# Patient Record
Sex: Male | Born: 1950 | Race: Black or African American | Hispanic: No | Marital: Married | Smoking: Never smoker
Health system: Southern US, Community
[De-identification: ages and names within clinical notes are randomized; demographics above are authoritative.]

## PROBLEM LIST (undated history)

## (undated) DIAGNOSIS — R7303 Prediabetes: Secondary | ICD-10-CM

## (undated) DIAGNOSIS — C801 Malignant (primary) neoplasm, unspecified: Secondary | ICD-10-CM

## (undated) DIAGNOSIS — I77810 Thoracic aortic ectasia: Secondary | ICD-10-CM

## (undated) DIAGNOSIS — M109 Gout, unspecified: Secondary | ICD-10-CM

## (undated) DIAGNOSIS — R0600 Dyspnea, unspecified: Secondary | ICD-10-CM

## (undated) DIAGNOSIS — I10 Essential (primary) hypertension: Secondary | ICD-10-CM

## (undated) DIAGNOSIS — Z86718 Personal history of other venous thrombosis and embolism: Secondary | ICD-10-CM

## (undated) DIAGNOSIS — I499 Cardiac arrhythmia, unspecified: Secondary | ICD-10-CM

## (undated) HISTORY — PX: COLONOSCOPY W/ POLYPECTOMY: SHX1380

---

## 2005-11-24 ENCOUNTER — Emergency Department: Payer: Self-pay | Admitting: Emergency Medicine
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
## 2009-05-02 DIAGNOSIS — I10 Essential (primary) hypertension: Secondary | ICD-10-CM | POA: Insufficient documentation

## 2009-07-14 DIAGNOSIS — E785 Hyperlipidemia, unspecified: Secondary | ICD-10-CM | POA: Insufficient documentation

## 2009-10-22 ENCOUNTER — Ambulatory Visit: Payer: Self-pay | Admitting: Family Medicine

## 2011-03-08 IMAGING — US US EXTREM LOW VENOUS*R*
1 series · 17 of 20 positions shown · non-contrast
Comparison: none

REASON FOR EXAM: call report  [id number]  knee pain hx DVT  eval for DVT and
popliteal
COMMENTS:

[Series 1: us extrem low venous*right* · 17 of 20 slices shown]
[im 1/20]
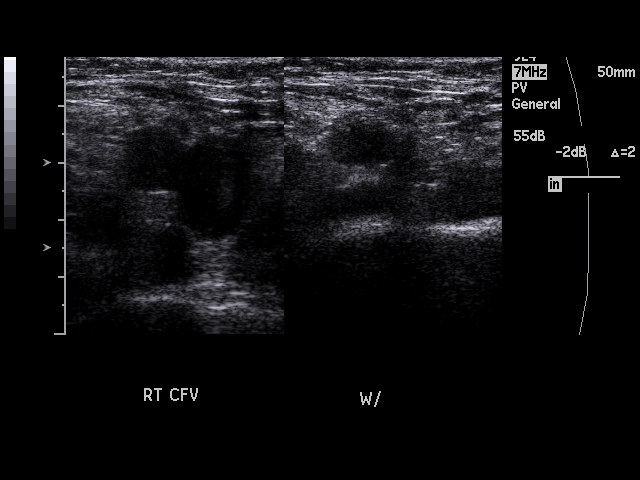
[im 2/20]
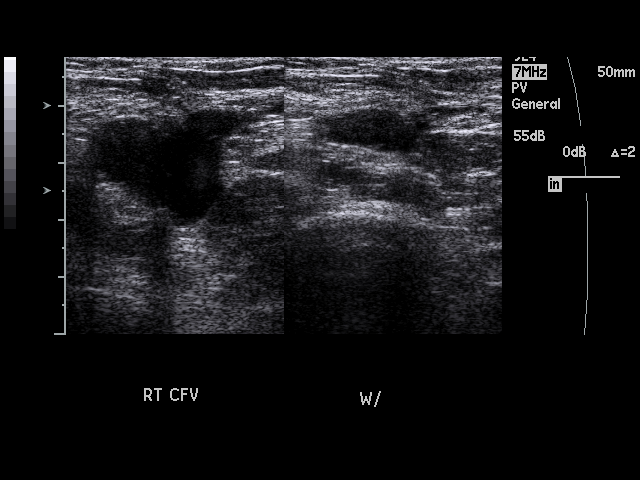
[im 3/20]
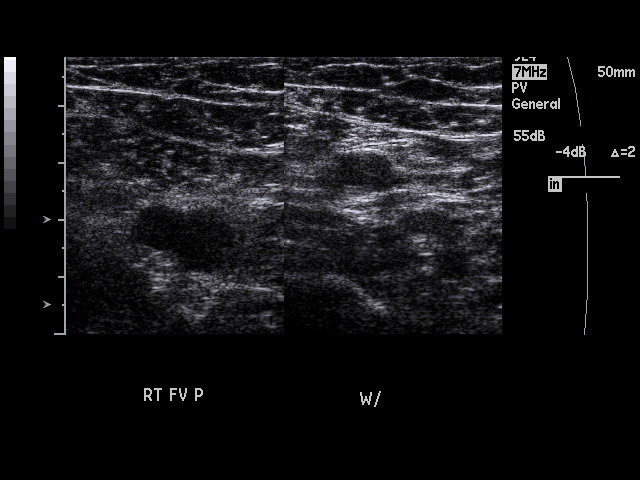
[im 5/20]
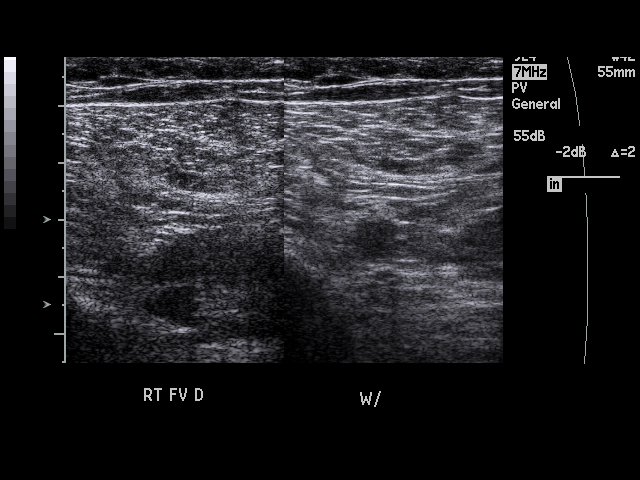
[im 6/20]
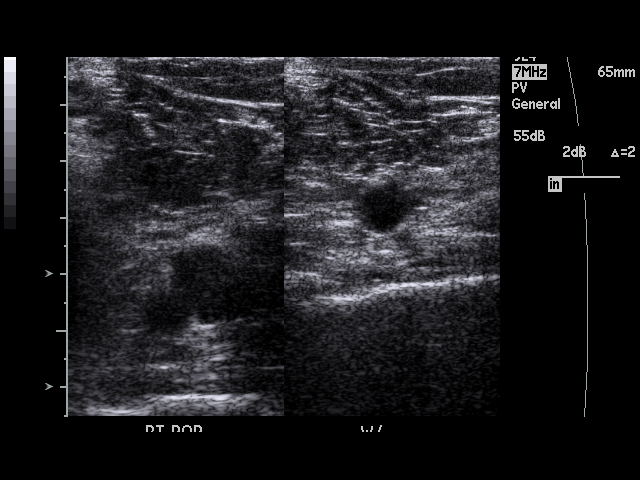
[im 7/20]
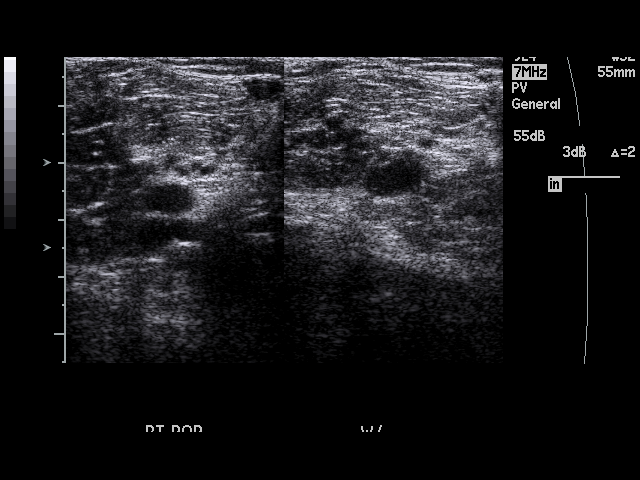
[im 8/20]
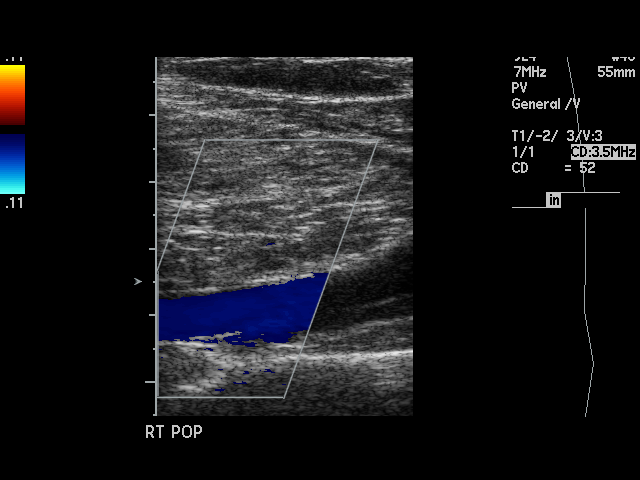
[im 9/20]
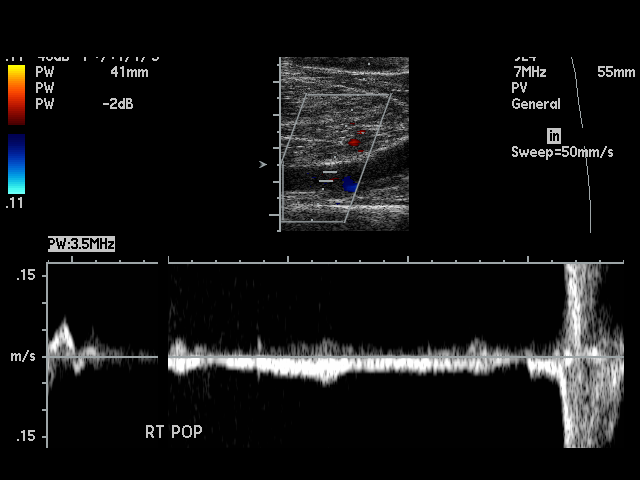
[im 11/20]
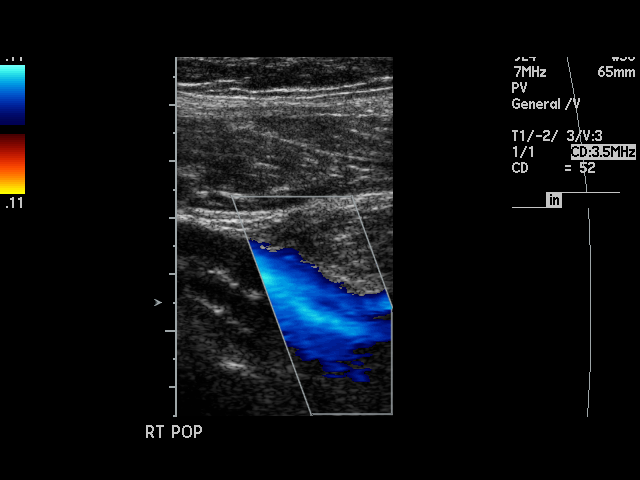
[im 12/20]
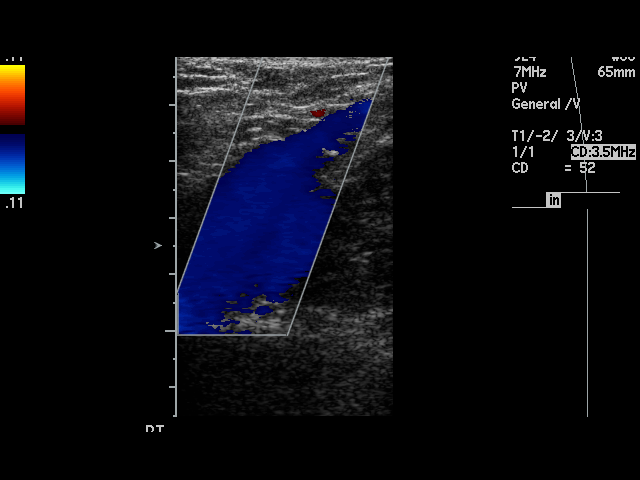
[im 13/20]
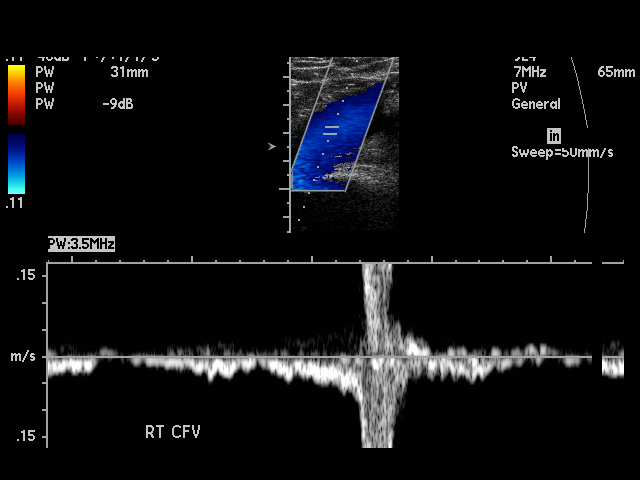
[im 14/20]
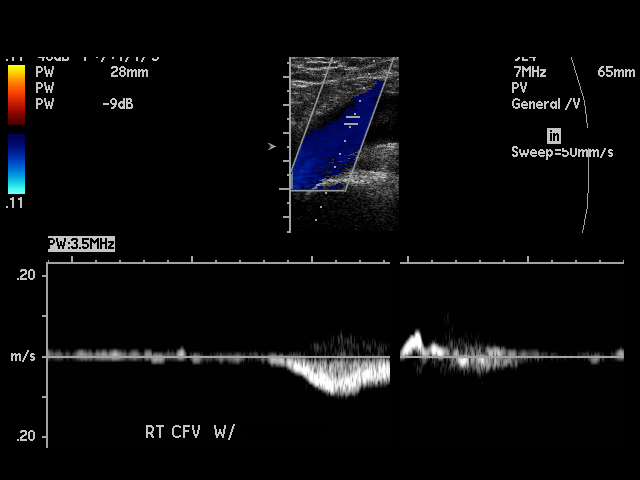
[im 15/20]
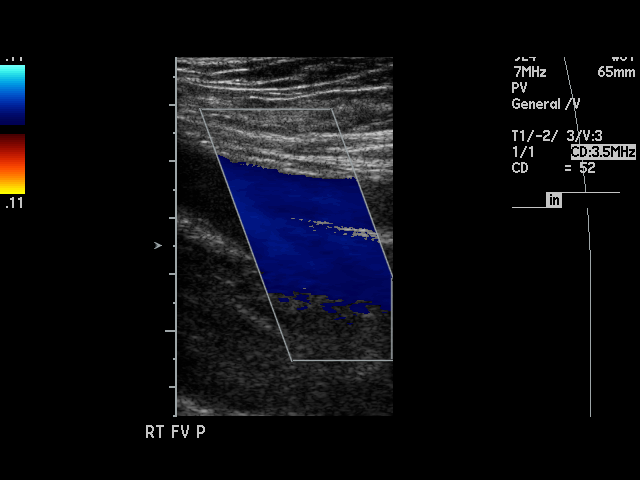
[im 16/20]
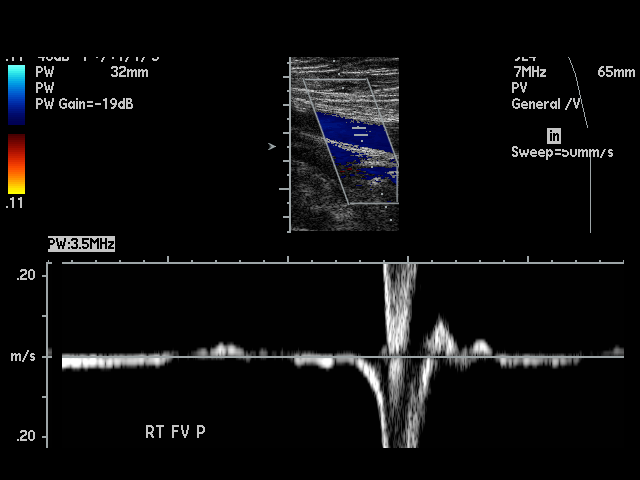
[im 18/20]
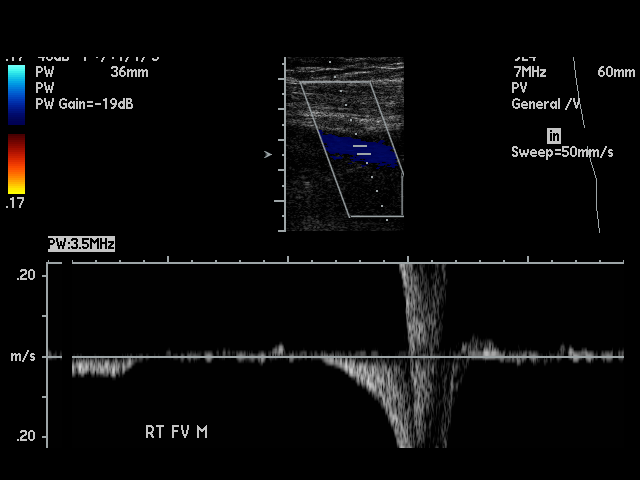
[im 19/20]
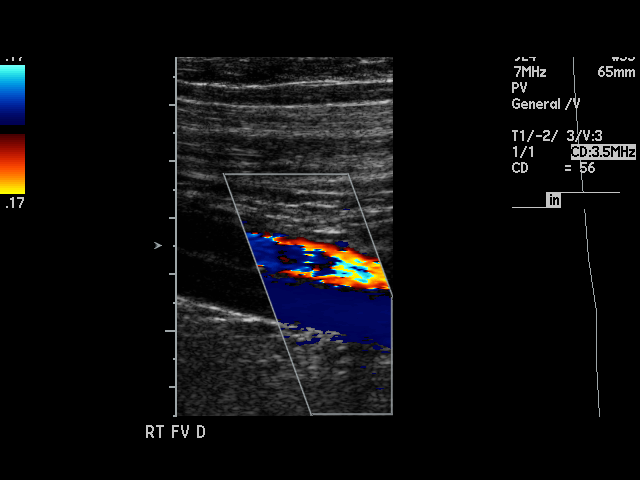
[im 20/20]
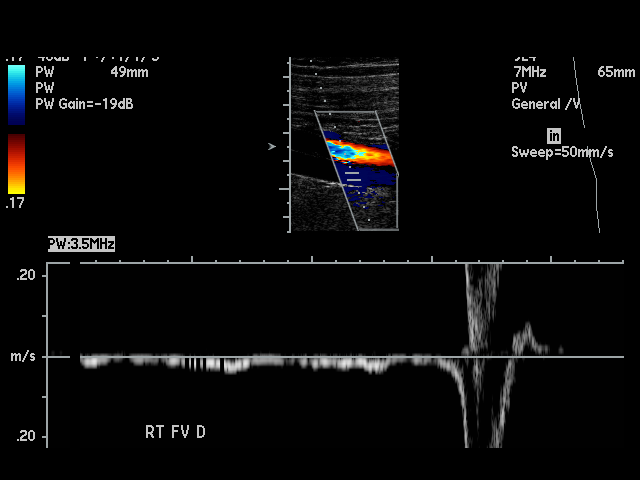

[17 of 20 positions shown; findings below may reference images not displayed]

PROCEDURE:     US  - US DOPPLER LOW EXTR RIGHT  - October 22, 2009 [DATE]

RESULT:     The augmentation, Valsalva and phasic flow waveforms are normal.
The femoral and popliteal vein shows complete compressibility throughout its
course. Doppler examination shows no occlusion or evidence for deep vein
thrombosis.
IMPRESSION: No deep vein thrombosis is identified in the right leg.

## 2012-04-19 DIAGNOSIS — F5221 Male erectile disorder: Secondary | ICD-10-CM | POA: Insufficient documentation

## 2012-04-21 DIAGNOSIS — E291 Testicular hypofunction: Secondary | ICD-10-CM | POA: Insufficient documentation

## 2014-01-03 DIAGNOSIS — R972 Elevated prostate specific antigen [PSA]: Secondary | ICD-10-CM | POA: Insufficient documentation

## 2014-03-15 DIAGNOSIS — M109 Gout, unspecified: Secondary | ICD-10-CM | POA: Insufficient documentation

## 2014-12-05 ENCOUNTER — Ambulatory Visit
Admission: RE | Admit: 2014-12-05 | Discharge: 2014-12-05 | Disposition: A | Discharge status: Home / Self Care | Payer: BLUE CROSS/BLUE SHIELD | Source: Intra-hospital | Attending: Family Medicine | Admitting: Family Medicine

## 2014-12-05 ENCOUNTER — Other Ambulatory Visit: Payer: Self-pay | Admitting: Family Medicine

## 2014-12-05 ENCOUNTER — Ambulatory Visit
Admission: RE | Admit: 2014-12-05 | Discharge: 2014-12-05 | Disposition: A | Discharge status: Home / Self Care | Payer: BLUE CROSS/BLUE SHIELD | Source: Ambulatory Visit | Attending: Family Medicine | Admitting: Family Medicine

## 2014-12-05 DIAGNOSIS — M109 Gout, unspecified: Secondary | ICD-10-CM | POA: Insufficient documentation

## 2014-12-05 DIAGNOSIS — M7989 Other specified soft tissue disorders: Secondary | ICD-10-CM | POA: Diagnosis present

## 2014-12-05 DIAGNOSIS — R609 Edema, unspecified: Secondary | ICD-10-CM

## 2014-12-05 DIAGNOSIS — I10 Essential (primary) hypertension: Secondary | ICD-10-CM | POA: Insufficient documentation

## 2016-02-21 ENCOUNTER — Other Ambulatory Visit: Payer: Self-pay | Admitting: Family Medicine

## 2016-02-21 ENCOUNTER — Ambulatory Visit
Admission: RE | Admit: 2016-02-21 | Discharge: 2016-02-21 | Disposition: A | Discharge status: Home / Self Care | Payer: Medicare Other | Source: Ambulatory Visit | Attending: Family Medicine | Admitting: Family Medicine

## 2016-02-21 DIAGNOSIS — M79604 Pain in right leg: Secondary | ICD-10-CM | POA: Insufficient documentation

## 2016-03-29 ENCOUNTER — Emergency Department
Admission: EM | Admit: 2016-03-29 | Discharge: 2016-03-29 | Disposition: A | Discharge status: Home / Self Care | Payer: Medicare Other | Attending: Emergency Medicine | Admitting: Emergency Medicine

## 2016-03-29 ENCOUNTER — Encounter: Payer: Self-pay | Admitting: Medical Oncology

## 2016-03-29 ENCOUNTER — Emergency Department: Payer: Medicare Other

## 2016-03-29 DIAGNOSIS — M79604 Pain in right leg: Secondary | ICD-10-CM | POA: Diagnosis present

## 2016-03-29 DIAGNOSIS — M10071 Idiopathic gout, right ankle and foot: Secondary | ICD-10-CM | POA: Insufficient documentation

## 2016-03-29 DIAGNOSIS — I10 Essential (primary) hypertension: Secondary | ICD-10-CM | POA: Insufficient documentation

## 2016-03-29 HISTORY — DX: Personal history of other venous thrombosis and embolism: Z86.718

## 2016-03-29 HISTORY — DX: Gout, unspecified: M10.9

## 2016-03-29 HISTORY — DX: Essential (primary) hypertension: I10

## 2016-03-29 LAB — CBC
HEMATOCRIT: 38.6 % — AB (ref 40.0–52.0)
HEMOGLOBIN: 13 g/dL (ref 13.0–18.0)
MCH: 28.5 pg (ref 26.0–34.0)
MCHC: 33.6 g/dL (ref 32.0–36.0)
MCV: 84.9 fL (ref 80.0–100.0)
Platelets: 232 10*3/uL (ref 150–440)
RBC: 4.55 MIL/uL (ref 4.40–5.90)
RDW: 16.3 % — ABNORMAL HIGH (ref 11.5–14.5)
WBC: 9 10*3/uL (ref 3.8–10.6)

## 2016-03-29 LAB — BASIC METABOLIC PANEL
ANION GAP: 7 (ref 5–15)
BUN: 17 mg/dL (ref 6–20)
CALCIUM: 9.5 mg/dL (ref 8.9–10.3)
CO2: 26 mmol/L (ref 22–32)
Chloride: 103 mmol/L (ref 101–111)
Creatinine, Ser: 1.1 mg/dL (ref 0.61–1.24)
GFR calc non Af Amer: >60 mL/min (ref >60)
GLUCOSE: 107 mg/dL — AB (ref 65–99)
POTASSIUM: 3.7 mmol/L (ref 3.5–5.1)
Sodium: 136 mmol/L (ref 135–145)

## 2016-03-29 LAB — URIC ACID: URIC ACID, SERUM: 8.9 mg/dL — AB (ref 4.4–7.6)

## 2016-03-29 MED ORDER — INDOMETHACIN 50 MG PO CAPS: 50.0000 mg | ORAL_CAPSULE | Freq: Two times a day (BID) | ORAL | 0 refills | Status: DC | PRN

## 2016-03-29 MED ORDER — INDOMETHACIN 50 MG PO CAPS
50.0000 mg | ORAL_CAPSULE | Freq: Once | ORAL | Status: AC
  Administered 2016-03-29: 50 mg via ORAL
  Filled 2016-03-29: qty 1

## 2016-03-29 NOTE — ED Provider Notes (Signed)
[facility] Emergency Department Provider Note ____________________________________________   I have reviewed the triage vital signs and the triage nursing note.  HISTORY  Chief Complaint Leg Pain   Historian Patient  HPI Bruce Brady is a 65 y.o. male with a history of gout, presents since Thursday with right foot pain extending all the way up into his thigh. He's had some swelling and redness. Redness around the right MTP joint. Swelling across the foot and up into the calf. Pain is moderate to severe. Worse with walking. No fevers. No trouble breathing and chest pain.  He reports several months ago he had a gout flare and was prescribed a pain medication which he is out of.    Past Medical History:  Diagnosis Date  . Gout   . H/O blood clots   . Hypertension     There are no active problems to display for this patient.   History reviewed. No pertinent surgical history.  Prior to Admission medications   Medication Sig Start Date End Date Taking? Authorizing Provider  indomethacin (INDOCIN) 50 MG capsule Take 1 capsule (50 mg total) by mouth 2 (two) times daily as needed for moderate pain. 03/29/16   Governor Rooks, MD    Allergies no known allergies  No family history on file.  Social History Social History  Substance Use Topics  . Smoking status: Never Smoker  . Smokeless tobacco: Never Used  . Alcohol use Yes    Review of Systems  Constitutional: Negative for fever. Eyes: Negative for visual changes. ENT: Negative for sore throat. Cardiovascular: Negative for chest pain. Respiratory: Negative for shortness of breath. Gastrointestinal: Negative for abdominal pain, vomiting and diarrhea. Genitourinary: Negative for dysuria. Musculoskeletal: Negative for back pain. Skin: Negative for rash. Neurological: Negative for headache. 10 point Review of Systems otherwise negative ____________________________________________   PHYSICAL  EXAM:  VITAL SIGNS: ED Triage Vitals  Enc Vitals Group     BP 03/29/16 1104 (!) 154/106     Pulse Rate 03/29/16 1104 (!) 112     Resp 03/29/16 1104 20     Temp 03/29/16 1104 97.8 F (36.6 C)     Temp Source 03/29/16 1104 Oral     SpO2 03/29/16 1104 100 %     Weight 03/29/16 1105 203 lb (92.1 kg)     Height 03/29/16 1105 5\' 9"  (1.753 m)     Head Circumference --      Peak Flow --      Pain Score 03/29/16 1105 10     Pain Loc --      Pain Edu? --      Excl. in GC? --      Constitutional: Alert and oriented. Well appearing and in no distress. HEENT   Head: Normocephalic and atraumatic.      Eyes: Conjunctivae are normal. PERRL. Normal extraocular movements.      Ears:         Nose: No congestion/rhinnorhea.   Mouth/Throat: Mucous membranes are moist.   Neck: No stridor. Cardiovascular/Chest: Normal rate, regular rhythm.  No murmurs, rubs, or gallops. Respiratory: Normal respiratory effort without tachypnea nor retractions. Breath sounds are clear and equal bilaterally. No wheezes/rales/rhonchi. Gastrointestinal: Soft. No distention, no guarding, no rebound. Nontender.    Genitourinary/rectal:Deferred Musculoskeletal: Right MTP joint red and tender. Foot swollen up to about almost the knee. Moderate foot and calf tenderness. Neurologic:  Normal speech and language. No gross or focal neurologic deficits are appreciated. Skin:  Skin  is warm, dry and intact. No rash noted. Psychiatric: Mood and affect are normal. Speech and behavior are normal. Patient exhibits appropriate insight and judgment.  ____________________________________________   EKG I, Governor Rooksebecca Adylin Hankey, MD, the attending physician have personally viewed and interpreted all ECGs.  None ____________________________________________  LABS (pertinent positives/negatives)  Labs Reviewed  BASIC METABOLIC PANEL - Abnormal; Notable for the following:       Result Value   Glucose, Bld 107 (*)    All other  components within normal limits  CBC - Abnormal; Notable for the following:    HCT 38.6 (*)    RDW 16.3 (*)    All other components within normal limits  URIC ACID - Abnormal; Notable for the following:    Uric Acid, Serum 8.9 (*)    All other components within normal limits    ____________________________________________  RADIOLOGY All Xrays were viewed by me. Imaging interpreted by Radiologist.  Ultrasound right lower extremity:  Sonographic surgery of the right lower extremity negative for DVT. __________________________________________  PROCEDURES  Procedure(s) performed: None  Critical Care performed: None  ____________________________________________   ED COURSE / ASSESSMENT AND PLAN  Pertinent labs & imaging results that were available during my care of the patient were reviewed by me and considered in my medical decision making (see chart for details).   Mr. Bruce Brady is here for what appears to be a gout flare, but given the pain up into his leg, ultrasound was performed to ensure no DVT, and this study was negative for DVT.  I reviewed his prior chart history on care everywhere, and patient previously had a prescription for indomethacin, which he states worked well. I will prescribe the indomethacin at the same dose, twice daily for symptom control due to gout flare. He does have a primary care physician with whom he can follow-up.    CONSULTATIONS: None   Patient / Family / Caregiver informed of clinical course, medical decision-making process, and agree with plan.   I discussed return precautions, follow-up instructions, and discharged instructions with patient and/or family.   ___________________________________________   FINAL CLINICAL IMPRESSION(S) / ED DIAGNOSES   Final diagnoses:  Acute idiopathic gout of right foot              Note: This dictation was prepared with Dragon dictation. Any transcriptional errors that result from this  process are unintentional    Governor Rooksebecca Layton Tappan, MD 03/29/16 1229

## 2016-03-29 NOTE — ED Notes (Signed)
Patient transported to Ultrasound 

## 2016-03-29 NOTE — ED Triage Notes (Signed)
Pt to ed with c/o right knee pain since Thursday that radiates down into right foot, hx of gout and hx of blood clots.

## 2016-03-29 NOTE — Discharge Instructions (Signed)
You're being treated for gout, with a refill with your prior gout medication indomethacin.  Return to the emergency department for any worsening pain, fever, skin rash, or any other symptoms concerning to you.

## 2016-08-05 DIAGNOSIS — I517 Cardiomegaly: Secondary | ICD-10-CM | POA: Insufficient documentation

## 2018-05-04 IMAGING — US US EXTREM LOW VENOUS*R*
1 series · 13 of 24 positions shown · non-contrast
Comparison: None.

CLINICAL DATA: Right leg pain for 2 days



[Series 1: us extrem low venous*right* · 0.08mm/px · 13 of 34 slices shown]
[im 1/34]
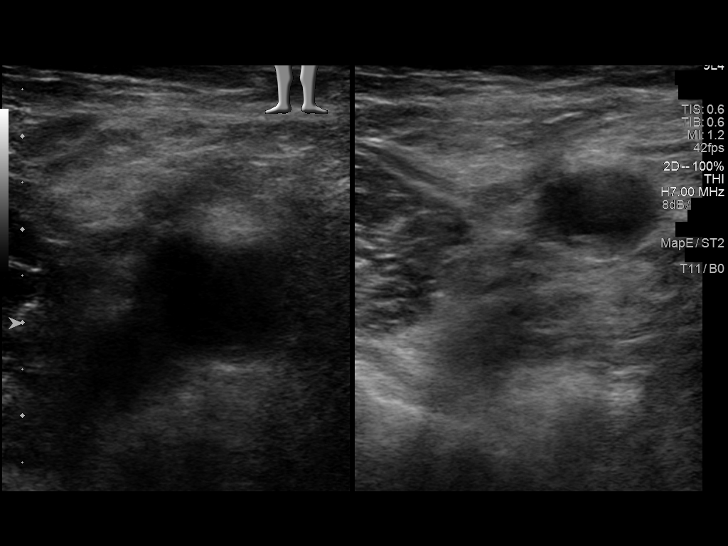
[im 3/34]
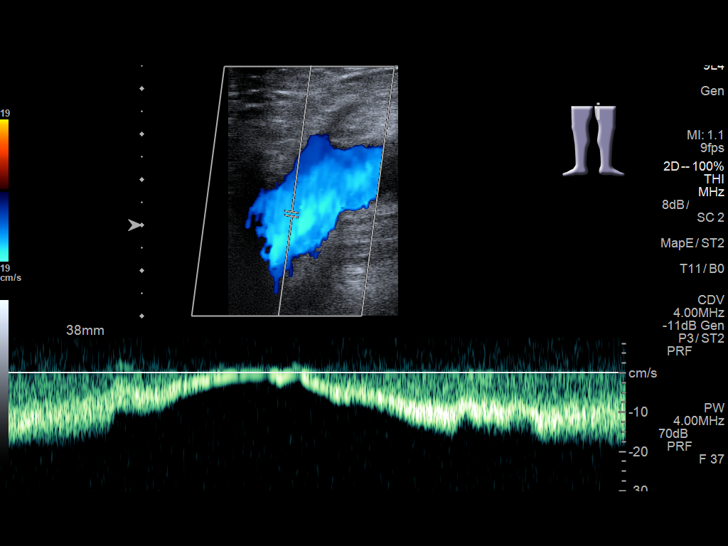
[im 6/34]
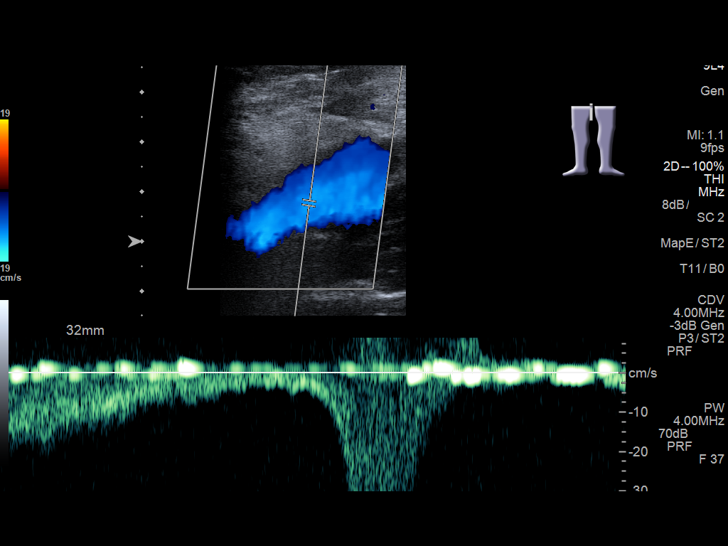
[im 9/34]
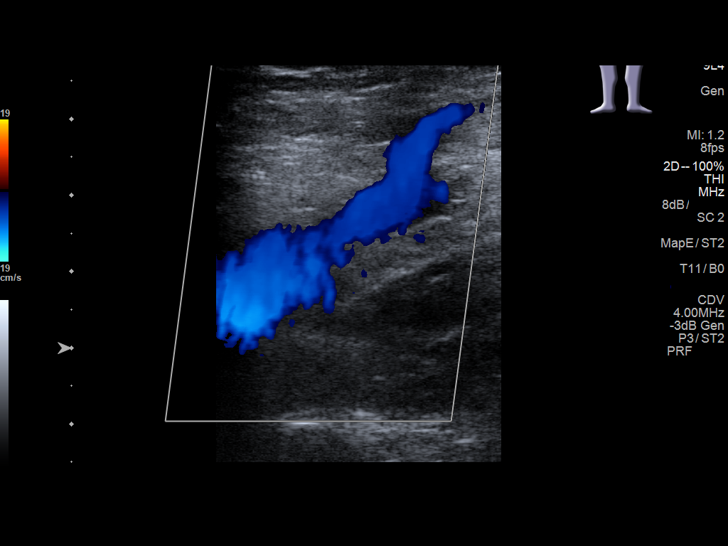
[im 12/34]
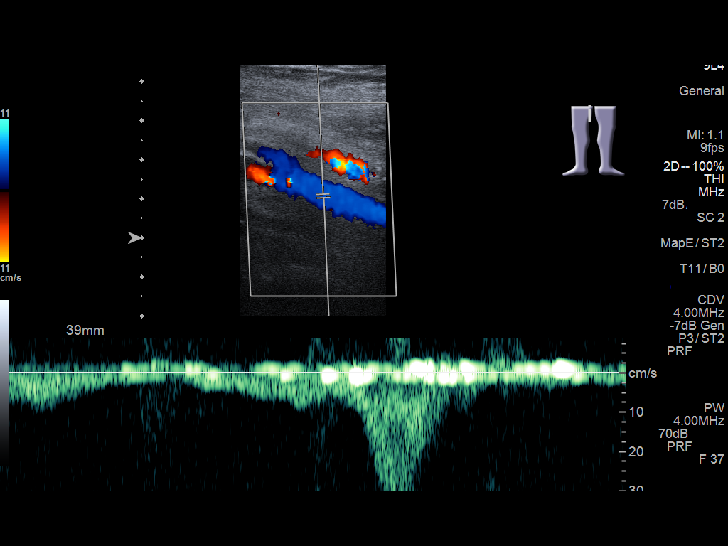
[im 15/34]
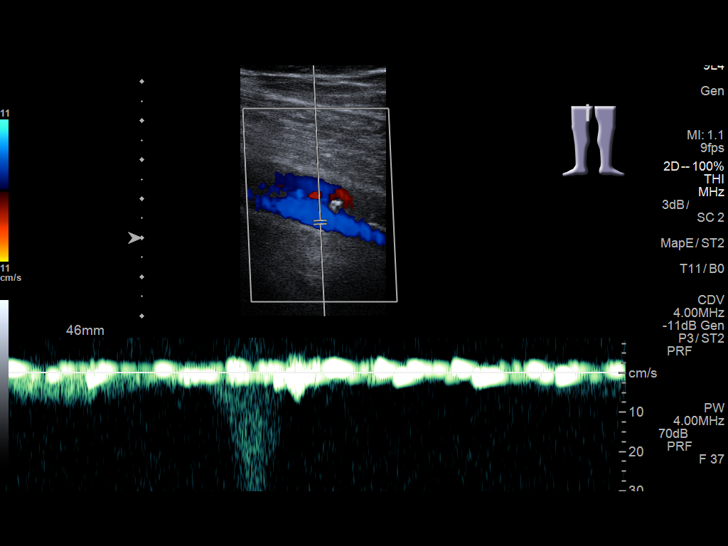
[im 18/34]
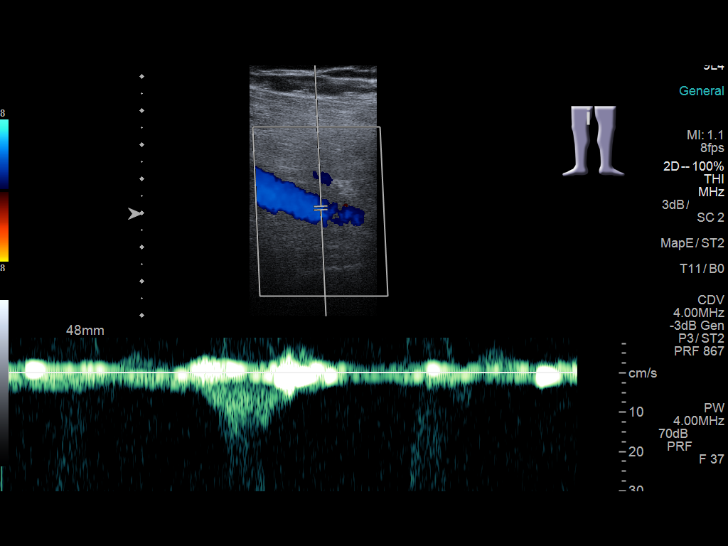
[im 19/34]
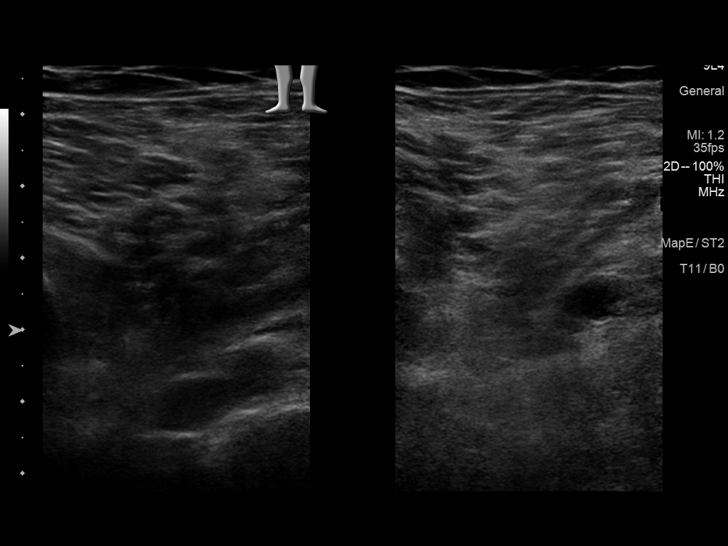
[im 22/34]
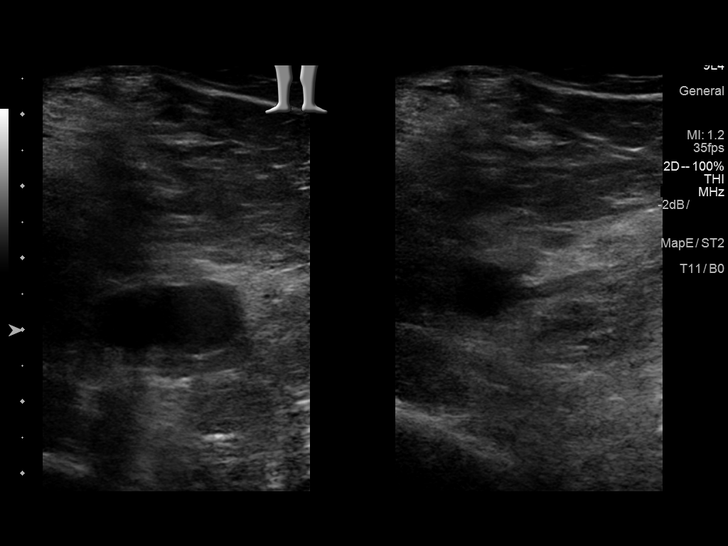
[im 25/34]
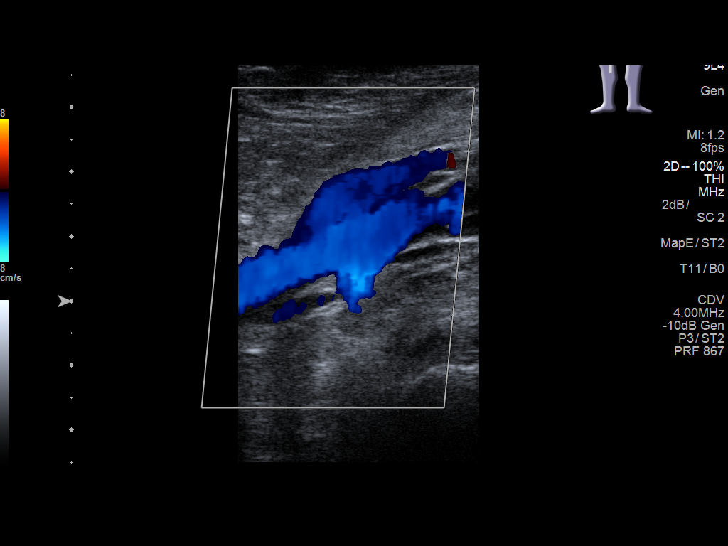
[im 28/34]
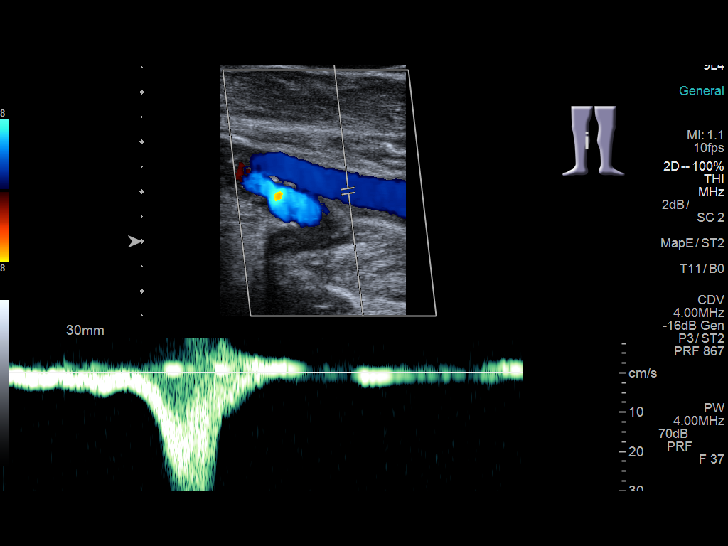
[im 31/34]
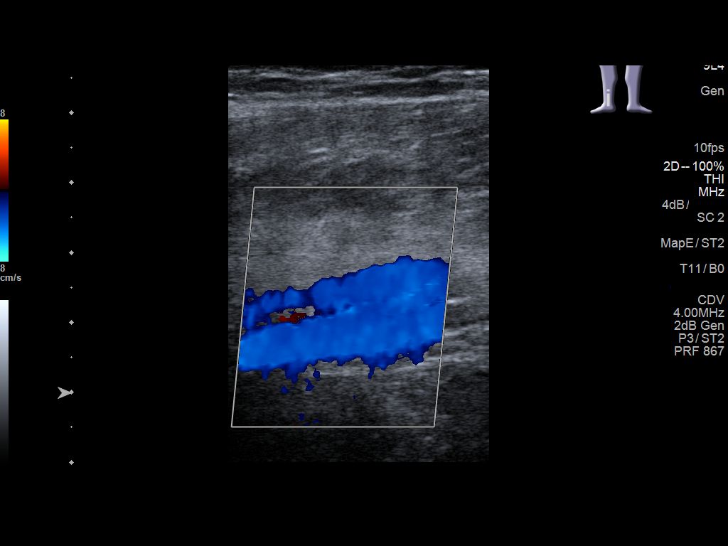
[im 34/34]
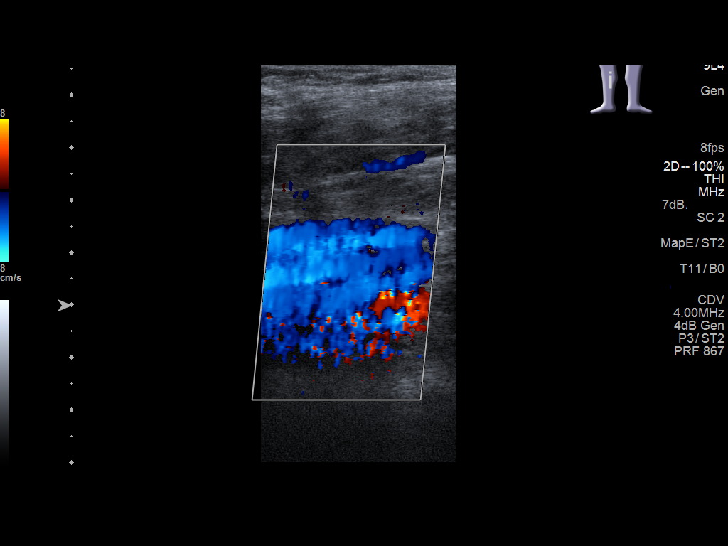

[13 of 24 positions shown; findings below may reference images not displayed]

FINDINGS: Contralateral Common Femoral Vein: Respiratory phasicity is normal
and symmetric with the symptomatic side. No evidence of thrombus.
Normal compressibility.

Common Femoral Vein: No evidence of thrombus. Normal
compressibility, respiratory phasicity and response to augmentation.

Saphenofemoral Junction: No evidence of thrombus. Normal
compressibility and flow on color Doppler imaging.

Profunda Femoral Vein: No evidence of thrombus. Normal
compressibility and flow on color Doppler imaging.

Femoral Vein: No evidence of thrombus. Normal compressibility,
respiratory phasicity and response to augmentation.

Popliteal Vein: No evidence of thrombus. Normal compressibility,
respiratory phasicity and response to augmentation.

Calf Veins: No evidence of thrombus. Normal compressibility and flow
on color Doppler imaging.

Superficial Great Saphenous Vein: No evidence of thrombus. Normal
compressibility and flow on color Doppler imaging.

Venous Reflux:  None.

Other Findings:  None.
IMPRESSION: No evidence of deep venous thrombosis.

## 2018-06-10 IMAGING — US US EXTREM LOW VENOUS*R*
1 series · 13 of 24 positions shown · non-contrast
Comparison: None.

CLINICAL DATA: 65-year-old male with a history



[Series 1: us extrem low venous*right* · 0.07mm/px · 13 of 34 slices shown]
[im 1/34]
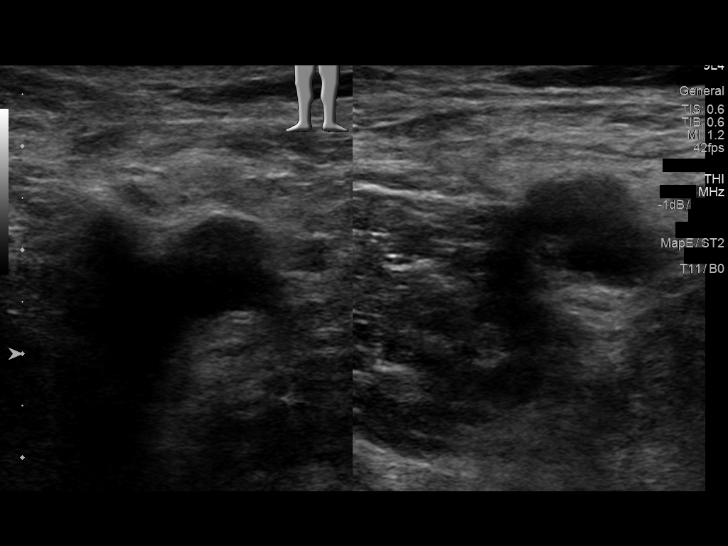
[im 3/34]
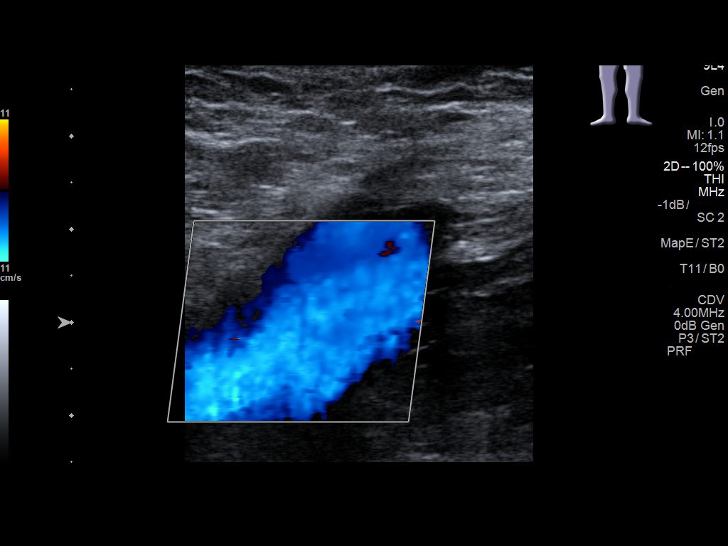
[im 6/34]
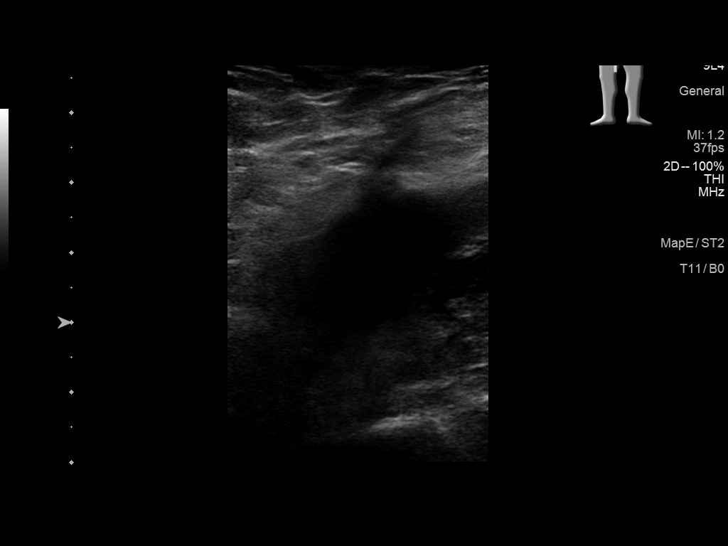
[im 9/34]
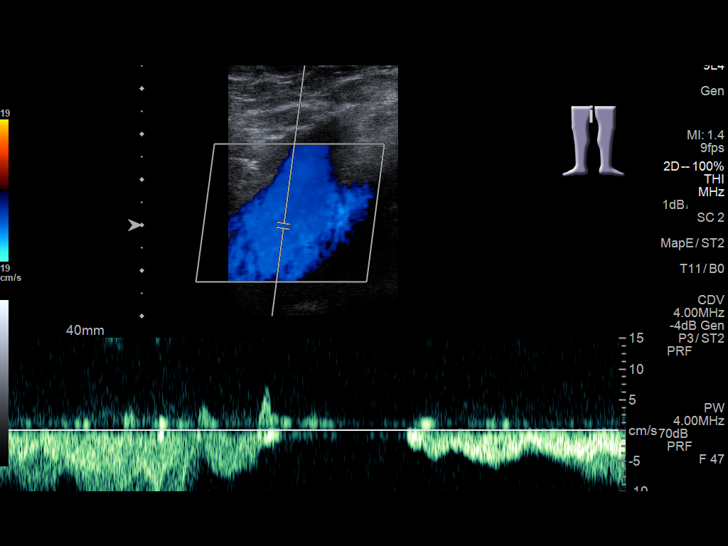
[im 12/34]
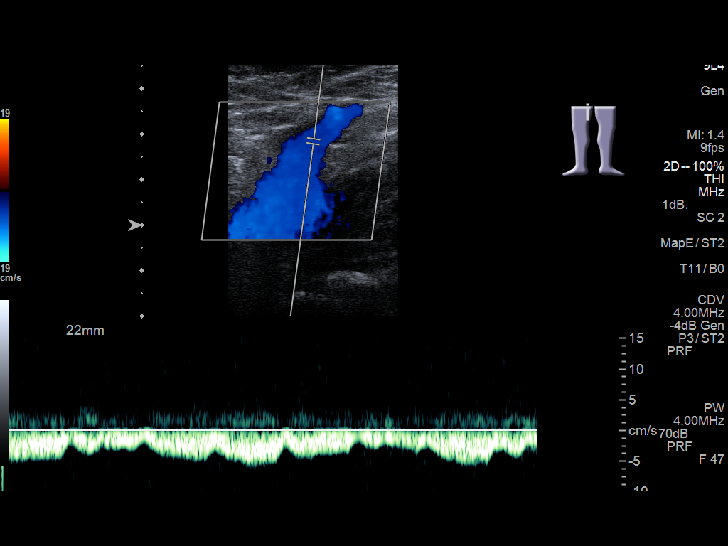
[im 15/34]
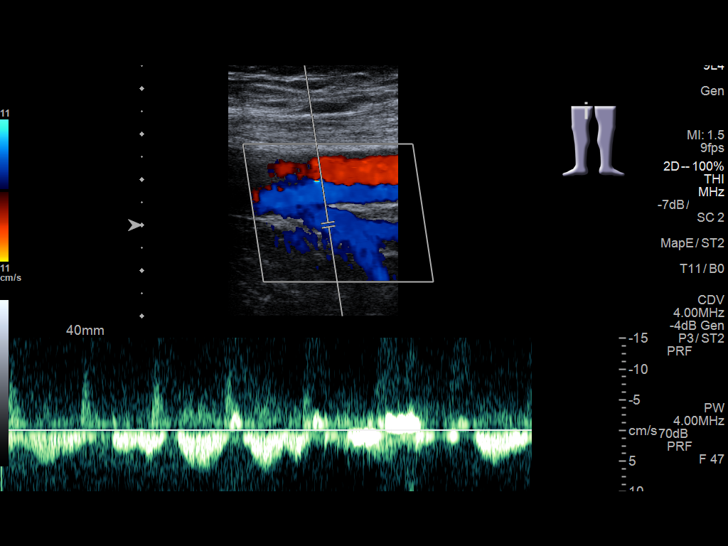
[im 18/34]
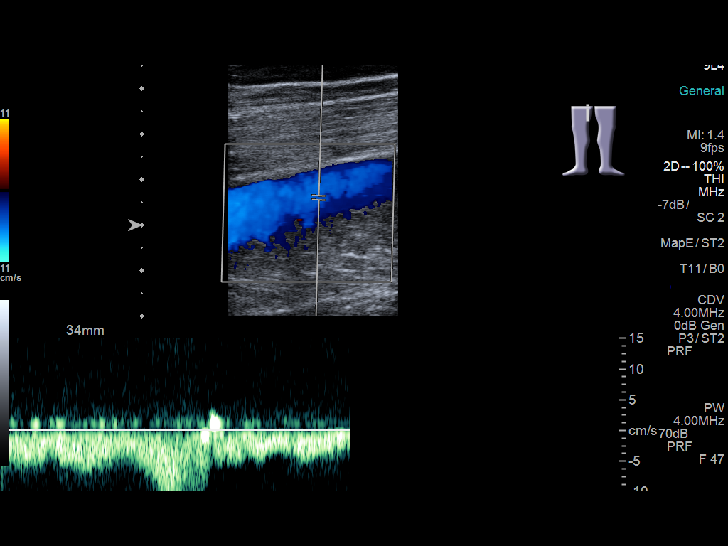
[im 19/34]
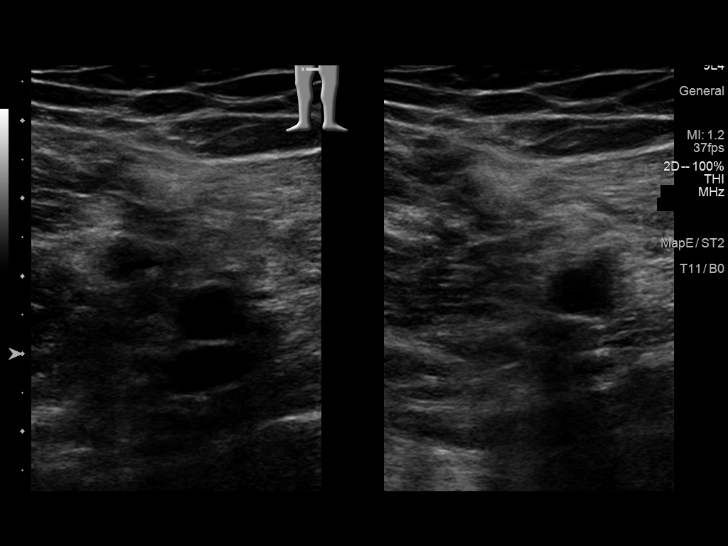
[im 22/34]
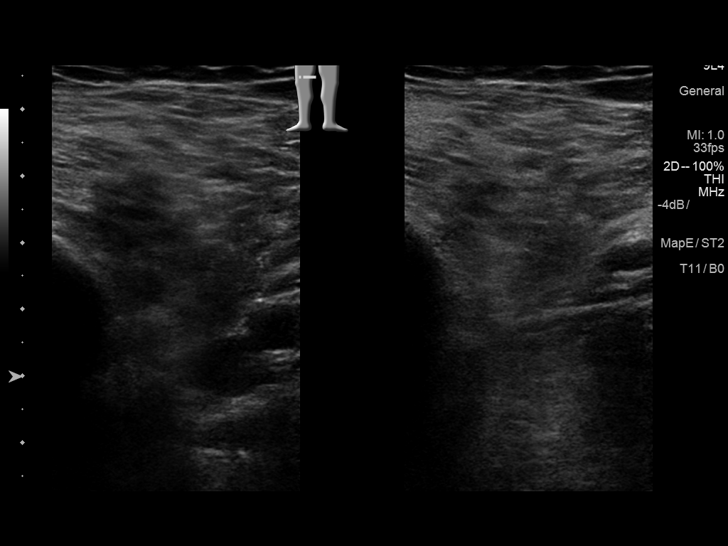
[im 25/34]
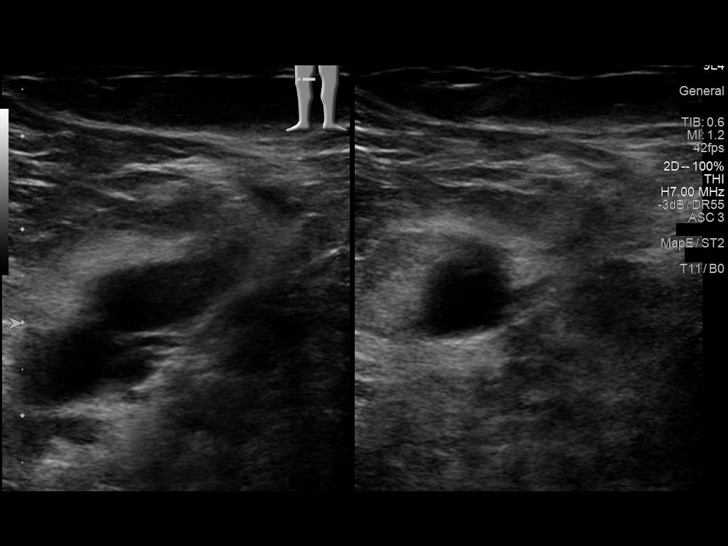
[im 28/34]
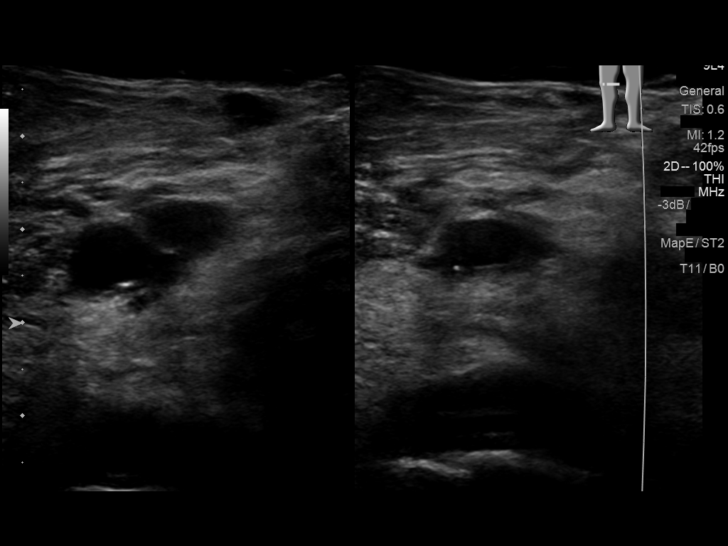
[im 31/34]
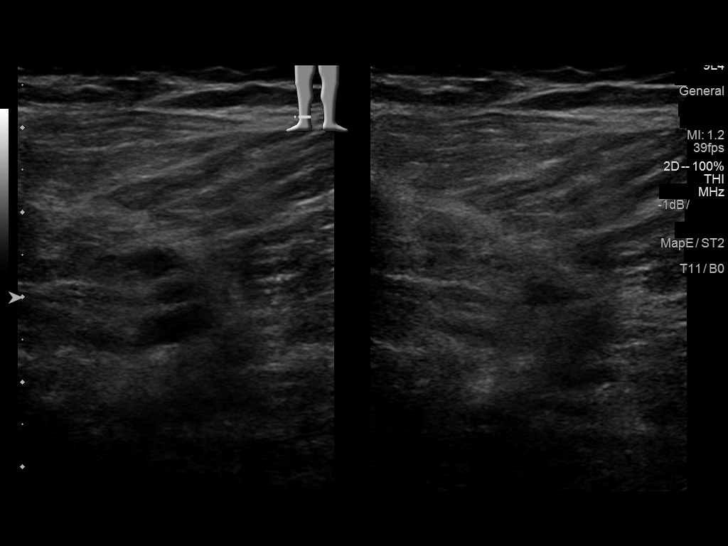
[im 34/34]
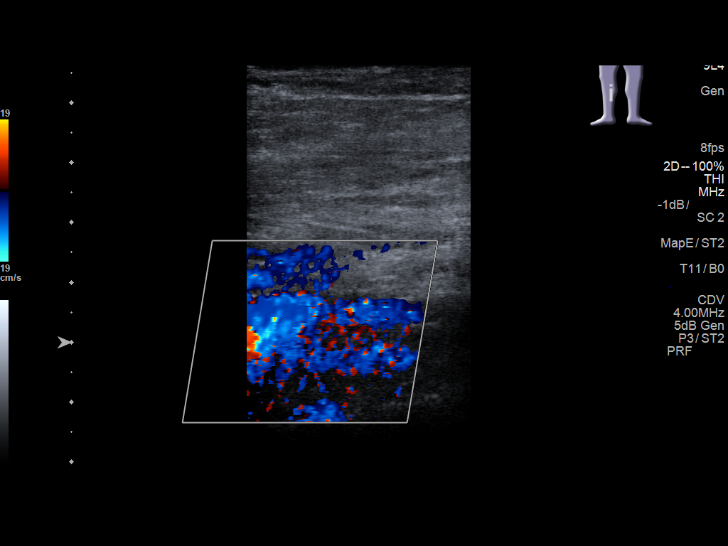

[13 of 24 positions shown; findings below may reference images not displayed]

FINDINGS: Contralateral Common Femoral Vein: Respiratory phasicity is normal
and symmetric with the symptomatic side. No evidence of thrombus.
Normal compressibility.

Common Femoral Vein: No evidence of thrombus. Normal
compressibility, respiratory phasicity and response to augmentation.

Saphenofemoral Junction: No evidence of thrombus. Normal
compressibility and flow on color Doppler imaging.

Profunda Femoral Vein: No evidence of thrombus. Normal
compressibility and flow on color Doppler imaging.

Femoral Vein: No evidence of thrombus. Normal compressibility,
respiratory phasicity and response to augmentation.

Popliteal Vein: No evidence of thrombus. Normal compressibility,
respiratory phasicity and response to augmentation.

Calf Veins: No evidence of thrombus. Normal compressibility and flow
on color Doppler imaging.

Superficial Great Saphenous Vein: No evidence of thrombus. Normal
compressibility and flow on color Doppler imaging.

Other Findings:  None.
IMPRESSION: Sonographic survey of the right lower extremity negative for DVT.

## 2021-01-23 DIAGNOSIS — I77810 Thoracic aortic ectasia: Secondary | ICD-10-CM | POA: Insufficient documentation

## 2022-04-23 DIAGNOSIS — K219 Gastro-esophageal reflux disease without esophagitis: Secondary | ICD-10-CM | POA: Insufficient documentation

## 2022-05-14 DIAGNOSIS — R7303 Prediabetes: Secondary | ICD-10-CM | POA: Insufficient documentation

## 2023-08-31 ENCOUNTER — Ambulatory Visit (INDEPENDENT_AMBULATORY_CARE_PROVIDER_SITE_OTHER): Payer: Medicare HMO | Admitting: Urology

## 2023-08-31 ENCOUNTER — Encounter: Payer: Self-pay | Admitting: Urology

## 2023-08-31 VITALS — BP 152/81 | HR 80 | Ht 68.0 in | Wt 210.1 lb

## 2023-08-31 DIAGNOSIS — N528 Other male erectile dysfunction: Secondary | ICD-10-CM

## 2023-08-31 DIAGNOSIS — R350 Frequency of micturition: Secondary | ICD-10-CM

## 2023-08-31 DIAGNOSIS — R972 Elevated prostate specific antigen [PSA]: Secondary | ICD-10-CM | POA: Diagnosis not present

## 2023-08-31 MED ORDER — SILDENAFIL CITRATE 20 MG PO TABS: 20.0000 mg | ORAL_TABLET | Freq: Three times a day (TID) | ORAL | 0 refills | Status: AC

## 2023-08-31 MED ORDER — TAMSULOSIN HCL 0.4 MG PO CAPS: 0.4000 mg | ORAL_CAPSULE | Freq: Every day | ORAL | 11 refills | Status: DC

## 2023-08-31 NOTE — Progress Notes (Signed)
 Bruce Brady,acting as a scribe for Rosina Riis, MD.,have documented all relevant documentation on the behalf of Rosina Riis, MD,as directed by  Rosina Riis, MD while in the presence of Rosina Riis, MD.  08/31/23 3:56 PM   Bruce Brady [birth year] [id number]  Referring provider: Buren Rock HERO, MD [address] [place],  [place] [address]  Chief Complaint  Patient presents with   Establish Care   Elevated PSA    HPI: 73 year-old male who is referred for further evaluation of elevated PSA. He has multiple medical comorbidities including dilated aortic root,  and hypertension, amongst others.  He was referred by [place] from [facility].   His PSA was known to be 5.2 in 06/2023. We do not have any other historical labs for comparison. He does not have any prostate imaging. He does not take any BPH medication.   Today, he reports urinary frequency, urinating about four times in the morning and three times at night. He denies a weak stream or difficulty urinating but experiences urgency. The frequency is bothersome but not severely uncomfortable.  He reports erectile dysfunction for about two to three years, with difficulty achieving erections. He has not tried any medications like Viagra  previously.  His sister was diagnosed with breast cancer three years ago at the age of 7.   PMH: Past Medical History:  Diagnosis Date   Gout    H/O blood clots    Hypertension     Surgical History: No past surgical history on file.  Home Medications:  Allergies as of 08/31/2023       Reactions   Hydrochlorothiazide Other (See Comments)   GOUT FLARE UP        Medication List        Accurate as of August 31, 2023  3:56 PM. If you have any questions, ask your nurse or doctor.          STOP taking these medications    indomethacin  50 MG capsule Commonly known as: INDOCIN  Stopped by: Rosina Riis       TAKE these medications     amLODipine 10 MG tablet Commonly known as: NORVASC Take 10 mg by mouth daily.   aspirin EC 81 MG tablet Take 1 tablet by mouth daily.   atorvastatin 10 MG tablet Commonly known as: LIPITOR Take 10 mg by mouth daily.   carvedilol 12.5 MG tablet Commonly known as: COREG Take 12.5 mg by mouth 2 (two) times daily.   losartan 100 MG tablet Commonly known as: COZAAR Take 100 mg by mouth daily.   sildenafil  20 MG tablet Commonly known as: REVATIO  Take 1 tablet (20 mg total) by mouth 3 (three) times daily. Started by: Rosina Riis   spironolactone 25 MG tablet Commonly known as: ALDACTONE Take 25 mg by mouth daily.   tamsulosin  0.4 MG Caps capsule Commonly known as: FLOMAX  Take 1 capsule (0.4 mg total) by mouth daily. Started by: Rosina Riis        Allergies:  Allergies  Allergen Reactions   Hydrochlorothiazide Other (See Comments)    GOUT FLARE UP     Social History:  reports that he has never smoked. He has never used smokeless tobacco. He reports current alcohol use. He reports that he does not use drugs.   Physical Exam: BP (!) 152/81   Pulse 80   Ht 5' 8 (1.727 m)   Wt 210 lb 2 oz (95.3 kg)   BMI  31.95 kg/m   Constitutional:  Alert and oriented, No acute distress. HEENT: [place] AT, moist mucus membranes.  Trachea midline, no masses. GU: Prostate is enlarged and diffusely firm, but no nodules are felt. Neurologic: Grossly intact, no focal deficits, moving all 4 extremities. Psychiatric: Normal mood and affect.   Assessment & Plan:    1. Elevated PSA -  We reviewed the implications of an elevated PSA and the uncertainty surrounding it. In general, a man's PSA increases with age and is produced by both normal and cancerous prostate tissue. Differential for elevated PSA is BPH, prostate cancer, infection, recent intercourse/ejaculation, prostate infarction, recent urethroscopic manipulation (foley placement/cystoscopy) and prostatitis. Management of an  elevated PSA can include observation or prostate biopsy and we discussed this in detail. We discussed that indications for prostate biopsy are defined by age and race specific PSA cutoffs as well as a PSA velocity of 0.75/year. - Repeat PSA test today to monitor levels. - No immediate need for prostate biopsy or imaging unless PSA levels continue to rise or symptoms worsen  2. Urinary frequency - Likely secondary to BPH given the enlarged prostate on examination. - Start Flomax  to alleviate urinary symptoms.  - Monitor symptom improvement at follow-up.  3. Erectile dysfunction - Likely related to age and possible vascular factors. - Prescribe Sildenafil  starting at a low dose, with instructions to titrate as needed.  - Discussed potential side effects and administration guidelines.  - Prescription sent to [facility].  Return in about 6 months (around 02/28/2024) for repeat PSA, IPSS, and PVR.  I have reviewed the above documentation for accuracy and completeness, and I agree with the above.   Rosina Riis, MD    [facility] [address], [address] [place], [place] [address] [phone]

## 2023-09-01 LAB — PSA: Prostate Specific Ag, Serum: 4.6 ng/mL — ABNORMAL HIGH (ref 0.0–4.0)

## 2023-10-12 ENCOUNTER — Other Ambulatory Visit: Payer: Self-pay

## 2023-10-12 ENCOUNTER — Encounter: Payer: Self-pay | Admitting: Ophthalmology

## 2023-10-27 NOTE — Discharge Instructions (Signed)

## 2023-10-31 ENCOUNTER — Ambulatory Visit: Payer: Self-pay | Admitting: Anesthesiology

## 2023-10-31 ENCOUNTER — Encounter: Payer: Self-pay | Admitting: Ophthalmology

## 2023-10-31 ENCOUNTER — Encounter
Admission: RE | Disposition: A | Discharge status: Home / Self Care | Payer: Self-pay | Source: Home / Self Care | Attending: Ophthalmology

## 2023-10-31 ENCOUNTER — Other Ambulatory Visit: Payer: Self-pay

## 2023-10-31 ENCOUNTER — Ambulatory Visit
Admission: RE | Admit: 2023-10-31 | Discharge: 2023-10-31 | Disposition: A | Discharge status: Home / Self Care | Payer: Medicare Other | Attending: Ophthalmology | Admitting: Ophthalmology

## 2023-10-31 DIAGNOSIS — I10 Essential (primary) hypertension: Secondary | ICD-10-CM | POA: Diagnosis not present

## 2023-10-31 DIAGNOSIS — K219 Gastro-esophageal reflux disease without esophagitis: Secondary | ICD-10-CM | POA: Insufficient documentation

## 2023-10-31 DIAGNOSIS — H2512 Age-related nuclear cataract, left eye: Secondary | ICD-10-CM | POA: Diagnosis present

## 2023-10-31 HISTORY — PX: CATARACT EXTRACTION W/PHACO: SHX586

## 2023-10-31 HISTORY — DX: Thoracic aortic ectasia: I77.810

## 2023-10-31 SURGERY — PHACOEMULSIFICATION, CATARACT, WITH IOL INSERTION
Anesthesia: Monitor Anesthesia Care | Site: Eye | Laterality: Left

## 2023-10-31 MED ORDER — TETRACAINE HCL 0.5 % OP SOLN
1.0000 [drp] | OPHTHALMIC | Status: DC | PRN
  Administered 2023-10-31 (×3): 1 [drp] via OPHTHALMIC

## 2023-10-31 MED ORDER — SIGHTPATH DOSE#1 NA HYALUR & NA CHOND-NA HYALUR IO KIT
PACK | INTRAOCULAR | Status: DC | PRN
  Administered 2023-10-31: 1 via OPHTHALMIC

## 2023-10-31 MED ORDER — TETRACAINE HCL 0.5 % OP SOLN
OPHTHALMIC | Status: AC
  Filled 2023-10-31: qty 4

## 2023-10-31 MED ORDER — LIDOCAINE HCL (PF) 2 % IJ SOLN
INTRAOCULAR | Status: DC | PRN
  Administered 2023-10-31: 4 mL via INTRAOCULAR

## 2023-10-31 MED ORDER — FENTANYL CITRATE (PF) 100 MCG/2ML IJ SOLN
INTRAMUSCULAR | Status: AC
  Filled 2023-10-31: qty 2

## 2023-10-31 MED ORDER — ARMC OPHTHALMIC DILATING DROPS
OPHTHALMIC | Status: AC
  Filled 2023-10-31: qty 0.5

## 2023-10-31 MED ORDER — MIDAZOLAM HCL 2 MG/2ML IJ SOLN
INTRAMUSCULAR | Status: AC
  Filled 2023-10-31: qty 2

## 2023-10-31 MED ORDER — MIDAZOLAM HCL 2 MG/2ML IJ SOLN
INTRAMUSCULAR | Status: DC | PRN
  Administered 2023-10-31: 2 mg via INTRAVENOUS

## 2023-10-31 MED ORDER — ARMC OPHTHALMIC DILATING DROPS
1.0000 | OPHTHALMIC | Status: DC | PRN
  Administered 2023-10-31 (×3): 1 via OPHTHALMIC

## 2023-10-31 MED ORDER — SIGHTPATH DOSE#1 BSS IO SOLN
INTRAOCULAR | Status: DC | PRN
  Administered 2023-10-31: 15 mL via INTRAOCULAR

## 2023-10-31 MED ORDER — MOXIFLOXACIN HCL 0.5 % OP SOLN
OPHTHALMIC | Status: DC | PRN
  Administered 2023-10-31: .2 mL via OPHTHALMIC

## 2023-10-31 MED ORDER — FENTANYL CITRATE (PF) 100 MCG/2ML IJ SOLN
INTRAMUSCULAR | Status: DC | PRN
  Administered 2023-10-31: 50 ug via INTRAVENOUS

## 2023-10-31 MED ORDER — SIGHTPATH DOSE#1 BSS IO SOLN
INTRAOCULAR | Status: DC | PRN
  Administered 2023-10-31: 108 mL via OPHTHALMIC

## 2023-10-31 SURGICAL SUPPLY — 12 items
CATARACT SUITE SIGHTPATH (MISCELLANEOUS) ×1 IMPLANT
DISSECTOR HYDRO NUCLEUS 50X22 (MISCELLANEOUS) ×1 IMPLANT
FEE CATARACT SUITE SIGHTPATH (MISCELLANEOUS) ×1 IMPLANT
GLOVE PI ULTRA LF STRL 7.5 (GLOVE) ×1 IMPLANT
GLOVE SURG POLYISOPRENE 8.5 (GLOVE) ×1 IMPLANT
GLOVE SURG PROTEXIS BL SZ6.5 (GLOVE) ×1 IMPLANT
GLOVE SURG SYN 6.5 PF PI BL (GLOVE) ×1 IMPLANT
GLOVE SURG SYN 8.5 PF PI BL (GLOVE) ×1 IMPLANT
LENS IOL TECNIS EYHANCE 21.0 (Intraocular Lens) IMPLANT
NDL FILTER BLUNT 18X1 1/2 (NEEDLE) ×1 IMPLANT
NEEDLE FILTER BLUNT 18X1 1/2 (NEEDLE) ×1 IMPLANT
SYR 3ML LL SCALE MARK (SYRINGE) ×1 IMPLANT

## 2023-10-31 NOTE — H&P (Signed)
 [facility]   Primary Care Physician:  Leanna Sato, MD Ophthalmologist: Dr. Willey Blade  Pre-Procedure History & Physical: HPI:  Bruce Brady is a 73 y.o. male here for cataract surgery.   Past Medical History:  Diagnosis Date   Dilated aortic root (HCC)    Gout    H/O blood clots    Hypertension     History reviewed. No pertinent surgical history.  Prior to Admission medications   Medication Sig Start Date End Date Taking? Authorizing Provider  amLODipine (NORVASC) 10 MG tablet Take 10 mg by mouth daily. 07/07/20  Yes [provider]  aspirin EC 81 MG tablet Take 1 tablet by mouth daily. 10/01/16  Yes [provider]  atorvastatin (LIPITOR) 10 MG tablet Take 10 mg by mouth daily.   Yes [provider]  carvedilol (COREG) 12.5 MG tablet Take 12.5 mg by mouth 2 (two) times daily.   Yes [provider]  losartan (COZAAR) 100 MG tablet Take 100 mg by mouth daily. 03/13/20  Yes [provider]  sildenafil (REVATIO) 20 MG tablet Take 1 tablet (20 mg total) by mouth 3 (three) times daily. 08/31/23  Yes Vanna Scotland, MD  spironolactone (ALDACTONE) 25 MG tablet Take 25 mg by mouth daily. 11/28/20  Yes [provider]  tamsulosin (FLOMAX) 0.4 MG CAPS capsule Take 1 capsule (0.4 mg total) by mouth daily. 08/31/23  Yes Vanna Scotland, MD    Allergies as of 07/28/2023   (No Known Allergies)    History reviewed. No pertinent family history.  Social History   Socioeconomic History   Marital status: Married    Spouse name: Not on file   Number of children: Not on file   Years of education: Not on file   Highest education level: Not on file  Occupational History   Not on file  Tobacco Use   Smoking status: Never   Smokeless tobacco: Never  Substance and Sexual Activity   Alcohol use: Yes   Drug use: No   Sexual activity: Not on file  Other Topics Concern   Not on file  Social History Narrative   Not on file    Social Drivers of Health   Financial Resource Strain: Not on file  Food Insecurity: Not on file  Transportation Needs: Not on file  Physical Activity: Not on file  Stress: Not on file  Social Connections: Not on file  Intimate Partner Violence: Not on file    Review of Systems: See HPI, otherwise negative ROS  Physical Exam: BP (!) 135/90   Pulse 83   Temp 98.1 F (36.7 C) (Temporal)   Resp 19   Ht 5\' 8"  (1.727 m)   Wt 96 kg   SpO2 98%   BMI 32.17 kg/m  General:   Alert, cooperative. Head:  Normocephalic and atraumatic. Respiratory:  Normal work of breathing. Cardiovascular:  NAD  Impression/Plan: [name] is here for cataract surgery.  Risks, benefits, limitations, and alternatives regarding cataract surgery have been reviewed with the patient.  Questions have been answered.  All parties agreeable.   Willey Blade, MD  10/31/2023, 11:31 AM

## 2023-10-31 NOTE — Anesthesia Preprocedure Evaluation (Addendum)
 Anesthesia Evaluation  Patient identified by MRN, date of birth, ID band Patient awake    Reviewed: Allergy & Precautions, H&P , NPO status , Patient's Chart, lab work & pertinent test results  Airway Mallampati: III  TM Distance: <3 FB Neck ROM: Full   Comment: Beard/mustache Dental no notable dental hx. (+) Poor Dentition, Missing, Chipped Denies any loose teeth, but multiple missing and poor dentition:   Pulmonary neg pulmonary ROS   Pulmonary exam normal breath sounds clear to auscultation       Cardiovascular hypertension, Normal cardiovascular exam Rhythm:Regular Rate:Normal     Neuro/Psych negative neurological ROS  negative psych ROS   GI/Hepatic negative GI ROS, Neg liver ROS,GERD  ,,  Endo/Other  negative endocrine ROS    Renal/GU negative Renal ROS  negative genitourinary   Musculoskeletal negative musculoskeletal ROS (+)    Abdominal   Peds negative pediatric ROS (+)  Hematology negative hematology ROS (+)   Anesthesia Other Findings Gout Blood clots hx dilated aortic root HTN  Reproductive/Obstetrics negative OB ROS                              Anesthesia Physical Anesthesia Plan  ASA: 3  Anesthesia Plan: MAC   Post-op Pain Management:    Induction: Intravenous  PONV Risk Score and Plan:   Airway Management Planned: Natural Airway and Nasal Cannula  Additional Equipment:   Intra-op Plan:   Post-operative Plan:   Informed Consent: I have reviewed the patients History and Physical, chart, labs and discussed the procedure including the risks, benefits and alternatives for the proposed anesthesia with the patient or authorized representative who has indicated his/her understanding and acceptance.     Dental Advisory Given  Plan Discussed with: Anesthesiologist, CRNA and Surgeon  Anesthesia Plan Comments: (Patient consented for risks of anesthesia including  but not limited to:  - adverse reactions to medications - damage to eyes, teeth, lips or other oral mucosa - nerve damage due to positioning  - sore throat or hoarseness - Damage to heart, brain, nerves, lungs, other parts of body or loss of life  Patient voiced understanding and assent.)         Anesthesia Quick Evaluation

## 2023-10-31 NOTE — Op Note (Signed)
 OPERATIVE NOTE  Bruce Brady [id number] 10/31/2023   PREOPERATIVE DIAGNOSIS:  Nuclear sclerotic cataract left eye.  H25.12   POSTOPERATIVE DIAGNOSIS:    Nuclear sclerotic cataract left eye.     PROCEDURE:  Phacoemusification with posterior chamber intraocular lens placement of the left eye   LENS:   Implant Name Type Inv. Item Serial No. Manufacturer Lot No. LRB No. Used Action  LENS IOL TECNIS EYHANCE 21.0 - [id number] Intraocular Lens LENS IOL TECNIS EYHANCE 21.0 [id number] [employer]  Left 1 Implanted      Procedure(s): CATARACT EXTRACTION PHACO AND INTRAOCULAR LENS PLACEMENT (IOC) LEFT 7.79 00:54.7 (Left)  SURGEON:  Willey Blade, MD, MPH   ANESTHESIA:  Topical with tetracaine drops augmented with 1% preservative-free intracameral lidocaine.  ESTIMATED BLOOD LOSS: <1 mL   COMPLICATIONS:  None.   DESCRIPTION OF PROCEDURE:  The patient was identified in the holding room and transported to the operating room and placed in the supine position under the operating microscope.  The left eye was identified as the operative eye and it was prepped and draped in the usual sterile ophthalmic fashion.   A 1.0 millimeter clear-corneal paracentesis was made at the 5:00 position. 0.5 ml of preservative-free 1% lidocaine with epinephrine was injected into the anterior chamber.  The anterior chamber was filled with viscoelastic.  A 2.4 millimeter keratome was used to make a near-clear corneal incision at the 2:00 position.  A curvilinear capsulorrhexis was made with a cystotome and capsulorrhexis forceps.  Balanced salt solution was used to hydrodissect and hydrodelineate the nucleus.   Phacoemulsification was then used in stop and chop fashion to remove the lens nucleus and epinucleus.  The remaining cortex was then removed using the irrigation and aspiration handpiece. Viscoelastic was then placed into the capsular bag to distend it for lens placement.  A lens was then injected into the capsular  bag.  The remaining viscoelastic was aspirated.   Wounds were hydrated with balanced salt solution.  The anterior chamber was inflated to a physiologic pressure with balanced salt solution.  Intracameral vigamox 0.1 mL undiltued was injected into the eye and a drop placed onto the ocular surface.  No wound leaks were noted.  The patient was taken to the recovery room in stable condition without complications of anesthesia or surgery  Willey Blade 10/31/2023, 11:59 AM

## 2023-10-31 NOTE — Anesthesia Postprocedure Evaluation (Signed)
 Anesthesia Post Note  Patient: Bruce Brady  Procedure(s) Performed: CATARACT EXTRACTION PHACO AND INTRAOCULAR LENS PLACEMENT (IOC) LEFT 7.79 00:54.7 (Left: Eye)  Patient location during evaluation: PACU Anesthesia Type: MAC Level of consciousness: awake and alert Pain management: pain level controlled Vital Signs Assessment: post-procedure vital signs reviewed and stable Respiratory status: spontaneous breathing, nonlabored ventilation, respiratory function stable and patient connected to nasal cannula oxygen Cardiovascular status: stable and blood pressure returned to baseline Postop Assessment: no apparent nausea or vomiting Anesthetic complications: no   No notable events documented.   Last Vitals:  Vitals:   10/31/23 1201 10/31/23 1205  BP: 126/84 125/78  Pulse: 79 76  Resp: 16 (!) 22  Temp: (!) 36.3 C (!) 36.3 C  SpO2: 98% 96%    Last Pain:  Vitals:   10/31/23 1205  TempSrc:   PainSc: 0-No pain                 Adelei Scobey C Benedicto Capozzi

## 2023-10-31 NOTE — Transfer of Care (Signed)
 Immediate Anesthesia Transfer of Care Note  Patient: Bruce Brady  Procedure(s) Performed: CATARACT EXTRACTION PHACO AND INTRAOCULAR LENS PLACEMENT (IOC) LEFT (Left: Eye)  Patient Location: PACU  Anesthesia Type: MAC  Level of Consciousness: awake, alert  and patient cooperative  Airway and Oxygen Therapy: Patient Spontanous Breathing and Patient connected to supplemental oxygen  Post-op Assessment: Post-op Vital signs reviewed, Patient's Cardiovascular Status Stable, Respiratory Function Stable, Patent Airway and No signs of Nausea or vomiting  Post-op Vital Signs: Reviewed and stable  Complications: No notable events documented.

## 2023-11-01 ENCOUNTER — Encounter: Payer: Self-pay | Admitting: Ophthalmology

## 2023-11-01 NOTE — Anesthesia Preprocedure Evaluation (Addendum)
 Anesthesia Evaluation  Patient identified by MRN, date of birth, ID band Patient awake    Reviewed: Allergy & Precautions, H&P , NPO status , Patient's Chart, lab work & pertinent test results  Airway Mallampati: III  TM Distance: <3 FB Neck ROM: full    Dental  (+) Poor Dentition, Missing Denies any loose teeth, but multiple missing and poor dentition: :   Pulmonary neg pulmonary ROS   Pulmonary exam normal        Cardiovascular hypertension, Normal cardiovascular exam  10/22 ECHO: Summary   1. The left ventricle is normal in size with moderately increased wall  thickness.   2. The left ventricular systolic function is normal, LVEF is visually  estimated at 55%.    3. The mitral valve leaflets are mildly thickened with normal leaflet  mobility.   4. There is mild mitral valve regurgitation.    5. The aortic valve is trileaflet with mildly thickened leaflets with normal  excursion.   6. The left atrium is moderately dilated in size.    7. The right ventricle is normal in size, with normal systolic function.    8. The right atrium is mildly dilated  in size.     Neuro/Psych negative neurological ROS  negative psych ROS   GI/Hepatic Neg liver ROS,GERD  ,,  Endo/Other  negative endocrine ROS    Renal/GU      Musculoskeletal   Abdominal   Peds  Hematology negative hematology ROS (+)   Anesthesia Other Findings H/O blood clots  Gout Hypertension  Dilated aortic root (HCC)  Previous cataract surgery 10-31-23 Dr. Aldo Amble  Reproductive/Obstetrics negative OB ROS                             Anesthesia Physical Anesthesia Plan  ASA: 3  Anesthesia Plan: MAC   Post-op Pain Management:    Induction: Intravenous  PONV Risk Score and Plan:   Airway Management Planned: Natural Airway and Nasal Cannula  Additional Equipment:   Intra-op Plan:   Post-operative Plan:   Informed  Consent: I have reviewed the patients History and Physical, chart, labs and discussed the procedure including the risks, benefits and alternatives for the proposed anesthesia with the patient or authorized representative who has indicated his/her understanding and acceptance.     Dental Advisory Given  Plan Discussed with: Anesthesiologist, CRNA and Surgeon  Anesthesia Plan Comments:         Anesthesia Quick Evaluation

## 2023-11-14 ENCOUNTER — Other Ambulatory Visit: Payer: Self-pay

## 2023-11-14 ENCOUNTER — Encounter: Payer: Self-pay | Admitting: Ophthalmology

## 2023-11-14 ENCOUNTER — Ambulatory Visit: Payer: Self-pay | Admitting: Anesthesiology

## 2023-11-14 ENCOUNTER — Ambulatory Visit
Admission: RE | Admit: 2023-11-14 | Discharge: 2023-11-14 | Disposition: A | Discharge status: Home / Self Care | Payer: Medicare Other | Attending: Ophthalmology | Admitting: Ophthalmology

## 2023-11-14 ENCOUNTER — Encounter
Admission: RE | Disposition: A | Discharge status: Home / Self Care | Payer: Self-pay | Source: Home / Self Care | Attending: Ophthalmology

## 2023-11-14 DIAGNOSIS — H2511 Age-related nuclear cataract, right eye: Secondary | ICD-10-CM | POA: Diagnosis present

## 2023-11-14 DIAGNOSIS — I10 Essential (primary) hypertension: Secondary | ICD-10-CM | POA: Diagnosis not present

## 2023-11-14 DIAGNOSIS — I340 Nonrheumatic mitral (valve) insufficiency: Secondary | ICD-10-CM | POA: Insufficient documentation

## 2023-11-14 DIAGNOSIS — K219 Gastro-esophageal reflux disease without esophagitis: Secondary | ICD-10-CM | POA: Insufficient documentation

## 2023-11-14 HISTORY — PX: CATARACT EXTRACTION W/PHACO: SHX586

## 2023-11-14 SURGERY — PHACOEMULSIFICATION, CATARACT, WITH IOL INSERTION
Anesthesia: Monitor Anesthesia Care | Laterality: Right

## 2023-11-14 MED ORDER — MIDAZOLAM HCL 2 MG/2ML IJ SOLN
INTRAMUSCULAR | Status: AC
  Filled 2023-11-14: qty 2

## 2023-11-14 MED ORDER — ARMC OPHTHALMIC DILATING DROPS
OPHTHALMIC | Status: AC
  Filled 2023-11-14: qty 0.5

## 2023-11-14 MED ORDER — FENTANYL CITRATE (PF) 100 MCG/2ML IJ SOLN
INTRAMUSCULAR | Status: DC | PRN
Start: 2023-11-14 — End: 2023-11-14
  Administered 2023-11-14: 50 ug via INTRAVENOUS
  Administered 2023-11-14 (×2): 25 ug via INTRAVENOUS

## 2023-11-14 MED ORDER — TETRACAINE HCL 0.5 % OP SOLN
OPHTHALMIC | Status: AC
  Filled 2023-11-14: qty 4

## 2023-11-14 MED ORDER — SIGHTPATH DOSE#1 BSS IO SOLN
INTRAOCULAR | Status: DC | PRN
  Administered 2023-11-14: 15 mL via INTRAOCULAR

## 2023-11-14 MED ORDER — SIGHTPATH DOSE#1 BSS IO SOLN
INTRAOCULAR | Status: DC | PRN
  Administered 2023-11-14: 151 mL via OPHTHALMIC

## 2023-11-14 MED ORDER — SIGHTPATH DOSE#1 NA HYALUR & NA CHOND-NA HYALUR IO KIT
PACK | INTRAOCULAR | Status: DC | PRN
  Administered 2023-11-14: 1 via OPHTHALMIC

## 2023-11-14 MED ORDER — ARMC OPHTHALMIC DILATING DROPS
1.0000 | OPHTHALMIC | Status: DC | PRN
  Administered 2023-11-14 (×3): 1 via OPHTHALMIC

## 2023-11-14 MED ORDER — MIDAZOLAM HCL 2 MG/2ML IJ SOLN
INTRAMUSCULAR | Status: DC | PRN
  Administered 2023-11-14 (×2): 1 mg via INTRAVENOUS

## 2023-11-14 MED ORDER — MOXIFLOXACIN HCL 0.5 % OP SOLN
OPHTHALMIC | Status: DC | PRN
  Administered 2023-11-14: .2 mL via OPHTHALMIC

## 2023-11-14 MED ORDER — FENTANYL CITRATE (PF) 100 MCG/2ML IJ SOLN
INTRAMUSCULAR | Status: AC
  Filled 2023-11-14: qty 2

## 2023-11-14 MED ORDER — LIDOCAINE HCL (PF) 2 % IJ SOLN
INTRAOCULAR | Status: DC | PRN
  Administered 2023-11-14: 1 mL via INTRAOCULAR

## 2023-11-14 MED ORDER — TETRACAINE HCL 0.5 % OP SOLN
1.0000 [drp] | OPHTHALMIC | Status: DC | PRN
  Administered 2023-11-14 (×3): 1 [drp] via OPHTHALMIC

## 2023-11-14 SURGICAL SUPPLY — 12 items
CATARACT SUITE SIGHTPATH (MISCELLANEOUS) ×1 IMPLANT
DISSECTOR HYDRO NUCLEUS 50X22 (MISCELLANEOUS) ×1 IMPLANT
FEE CATARACT SUITE SIGHTPATH (MISCELLANEOUS) ×1 IMPLANT
GLOVE PI ULTRA LF STRL 7.5 (GLOVE) ×1 IMPLANT
GLOVE SURG POLYISOPRENE 8.5 (GLOVE) ×1 IMPLANT
GLOVE SURG PROTEXIS BL SZ6.5 (GLOVE) ×1 IMPLANT
GLOVE SURG SYN 6.5 PF PI BL (GLOVE) ×1 IMPLANT
GLOVE SURG SYN 8.5 PF PI BL (GLOVE) ×1 IMPLANT
LENS IOL TECNIS EYHANCE 21.0 (Intraocular Lens) IMPLANT
NDL FILTER BLUNT 18X1 1/2 (NEEDLE) ×1 IMPLANT
NEEDLE FILTER BLUNT 18X1 1/2 (NEEDLE) ×1 IMPLANT
SYR 3ML LL SCALE MARK (SYRINGE) ×1 IMPLANT

## 2023-11-14 NOTE — Op Note (Signed)
 OPERATIVE NOTE  Bruce Brady [id number] 11/14/2023   PREOPERATIVE DIAGNOSIS:  Nuclear sclerotic cataract right eye.  H25.11   POSTOPERATIVE DIAGNOSIS:    Nuclear sclerotic cataract right eye.     PROCEDURE:  Phacoemusification with posterior chamber intraocular lens placement of the right eye   LENS:   Implant Name Type Inv. Item Serial No. Manufacturer Lot No. LRB No. Used Action  LENS IOL TECNIS EYHANCE 21.0 - [id number] Intraocular Lens LENS IOL TECNIS EYHANCE 21.0 [id number] [employer]  Right 1 Implanted       Procedure(s): CATARACT EXTRACTION PHACO AND INTRAOCULAR LENS PLACEMENT (IOC) RIGHT 9.53, 01:07.0 (Right)  SURGEON:  Dusty Gin, MD, MPH  ANESTHESIOLOGIST: Anesthesiologist: Baltazar Bonier, MD CRNA: Annamarie Kid, CRNA   ANESTHESIA:  Topical with tetracaine  drops augmented with 1% preservative-free intracameral lidocaine .  ESTIMATED BLOOD LOSS: less than 1 mL.   COMPLICATIONS:  None.   DESCRIPTION OF PROCEDURE:  The patient was identified in the holding room and transported to the operating room and placed in the supine position under the operating microscope.  The right eye was identified as the operative eye and it was prepped and draped in the usual sterile ophthalmic fashion.   A 1.0 millimeter clear-corneal paracentesis was made at the 10:30 position. 0.5 ml of preservative-free 1% lidocaine  with epinephrine  was injected into the anterior chamber.  The anterior chamber was filled with viscoelastic.  A 2.4 millimeter keratome was used to make a near-clear corneal incision at the 8:00 position.  A curvilinear capsulorrhexis was made with a cystotome and capsulorrhexis forceps.  Balanced salt  solution was used to hydrodissect and hydrodelineate the nucleus.   Phacoemulsification was then used in stop and chop fashion to remove the lens nucleus and epinucleus.  The remaining cortex was then removed using the irrigation and aspiration handpiece. Viscoelastic  was then placed into the capsular bag to distend it for lens placement.  A lens was then injected into the capsular bag.  The remaining viscoelastic was aspirated.   Wounds were hydrated with balanced salt  solution.  The anterior chamber was inflated to a physiologic pressure with balanced salt  solution.   Intracameral vigamox  0.1 mL undiluted was injected into the eye and a drop placed onto the ocular surface.  No wound leaks were noted.  The patient was taken to the recovery room in stable condition without complications of anesthesia or surgery  Dusty Gin 11/14/2023, 1:17 PM

## 2023-11-14 NOTE — Addendum Note (Signed)
 Addendum  created 11/14/23 1341 by Baltazar Bonier, MD   Attestation recorded in [place], Intraprocedure Attestations filed

## 2023-11-14 NOTE — Discharge Instructions (Signed)

## 2023-11-14 NOTE — Transfer of Care (Signed)
 Immediate Anesthesia Transfer of Care Note  Patient: Bruce Brady  Procedure(s) Performed: CATARACT EXTRACTION PHACO AND INTRAOCULAR LENS PLACEMENT (IOC) RIGHT 9.53, 01:07.0 (Right)  Patient Location: PACU  Anesthesia Type: MAC  Level of Consciousness: awake, alert  and patient cooperative  Airway and Oxygen Therapy: Patient Spontanous Breathing and Patient connected to supplemental oxygen  Post-op Assessment: Post-op Vital signs reviewed, Patient's Cardiovascular Status Stable, Respiratory Function Stable, Patent Airway and No signs of Nausea or vomiting  Post-op Vital Signs: Reviewed and stable  Complications: No notable events documented.

## 2023-11-14 NOTE — H&P (Signed)
 [facility]   Primary Care Physician:  Macie Saxon, MD Ophthalmologist: Dr. Dusty Gin  Pre-Procedure History & Physical: HPI:  Bruce Brady is a 73 y.o. male here for cataract surgery.   Past Medical History:  Diagnosis Date   Dilated aortic root (HCC)    Gout    H/O blood clots    Hypertension     Past Surgical History:  Procedure Laterality Date   CATARACT EXTRACTION W/PHACO Left 10/31/2023   Procedure: CATARACT EXTRACTION PHACO AND INTRAOCULAR LENS PLACEMENT (IOC) LEFT 7.79 00:54.7;  Surgeon: Rosa College, MD;  Location[facility];  Service: Ophthalmology;  Laterality: Left;    Prior to Admission medications   Medication Sig Start Date End Date Taking? Authorizing Provider  amLODipine (NORVASC) 10 MG tablet Take 10 mg by mouth daily. 07/07/20  Yes [provider]  aspirin EC 81 MG tablet Take 1 tablet by mouth daily. 10/01/16  Yes [provider]  atorvastatin (LIPITOR) 10 MG tablet Take 10 mg by mouth daily.   Yes [provider]  carvedilol (COREG) 12.5 MG tablet Take 12.5 mg by mouth 2 (two) times daily.   Yes [provider]  losartan (COZAAR) 100 MG tablet Take 100 mg by mouth daily. 03/13/20  Yes [provider]  sildenafil  (REVATIO ) 20 MG tablet Take 1 tablet (20 mg total) by mouth 3 (three) times daily. 08/31/23  Yes Dustin Gimenez, MD  spironolactone (ALDACTONE) 25 MG tablet Take 25 mg by mouth daily. 11/28/20  Yes [provider]  tamsulosin  (FLOMAX ) 0.4 MG CAPS capsule Take 1 capsule (0.4 mg total) by mouth daily. 08/31/23  Yes Dustin Gimenez, MD    Allergies as of 07/28/2023   (No Known Allergies)    History reviewed. No pertinent family history.  Social History   Socioeconomic History   Marital status: Married    Spouse name: Not on file   Number of children: Not on file   Years of education: Not on file   Highest education level: Not on file  Occupational History   Not on  file  Tobacco Use   Smoking status: Never   Smokeless tobacco: Never  Substance and Sexual Activity   Alcohol use: Yes   Drug use: No   Sexual activity: Not on file  Other Topics Concern   Not on file  Social History Narrative   Not on file   Social Drivers of Health   Financial Resource Strain: Not on file  Food Insecurity: Not on file  Transportation Needs: Not on file  Physical Activity: Not on file  Stress: Not on file  Social Connections: Not on file  Intimate Partner Violence: Not on file    Review of Systems: See HPI, otherwise negative ROS  Physical Exam: BP (!) 151/96   Pulse 92   Temp 98.1 F (36.7 C)   Resp 18   Ht 5' 7.99" (1.727 m)   Wt 96.1 kg   SpO2 97%   BMI 32.21 kg/m  General:   Alert, cooperative. Head:  Normocephalic and atraumatic. Respiratory:  Normal work of breathing. Cardiovascular:  NAD  Impression/Plan: Bruce Brady is here for cataract surgery.  Risks, benefits, limitations, and alternatives regarding cataract surgery have been reviewed with the patient.  Questions have been answered.  All parties agreeable.   Dusty Gin, MD  11/14/2023, 12:42 PM

## 2023-11-14 NOTE — Anesthesia Postprocedure Evaluation (Signed)
 Anesthesia Post Note  Patient: KAHLI FITZGERALD  Procedure(s) Performed: CATARACT EXTRACTION PHACO AND INTRAOCULAR LENS PLACEMENT (IOC) RIGHT 9.53, 01:07.0 (Right)  Patient location during evaluation: PACU Anesthesia Type: MAC Level of consciousness: awake and alert Pain management: pain level controlled Vital Signs Assessment: post-procedure vital signs reviewed and stable Respiratory status: spontaneous breathing, nonlabored ventilation and respiratory function stable Cardiovascular status: blood pressure returned to baseline and stable Postop Assessment: no apparent nausea or vomiting Anesthetic complications: no   No notable events documented.   Last Vitals:  Vitals:   11/14/23 1320 11/14/23 1323  BP: 122/77 119/80  Pulse: 75 70  Resp: 15 13  Temp:  36.6 C  SpO2: 97% 96%    Last Pain:  Vitals:   11/14/23 1323  PainSc: 0-No pain                 Baltazar Bonier

## 2024-02-28 ENCOUNTER — Other Ambulatory Visit: Payer: Medicare HMO

## 2024-03-05 NOTE — Progress Notes (Deleted)
 03/06/2024 1:08 PM   FLORIAN CHAUCA March 15, 1951 [id number]  Referring provider: Buren Rock HERO, MD [address] [place],  [place] [address]  Urological history: 1.  Elevated PSA - PSA (06/2023) 5.2   2. 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 ED - Sildenafil  20 mg, on-demand dosing  No chief complaint on file.  HPI: Bruce Brady is a 73 y.o. man who presents today for 6 months follow up for elevated PSA, BPH w/ LU TS and ED.  Previous records reviewed.   PVR ***   PMH: Past Medical History:  Diagnosis Date   Dilated aortic root (HCC)    Gout    H/O blood clots    Hypertension     Surgical History: Past Surgical History:  Procedure Laterality Date   CATARACT EXTRACTION W/PHACO Left 10/31/2023   Procedure: CATARACT EXTRACTION PHACO AND INTRAOCULAR LENS PLACEMENT (IOC) LEFT 7.79 00:54.7;  Surgeon: Myrna Adine Anes, MD;  Location: [facility];  Service: Ophthalmology;  Laterality: Left;   CATARACT EXTRACTION W/PHACO Right 11/14/2023   Procedure: CATARACT EXTRACTION PHACO AND INTRAOCULAR LENS PLACEMENT (IOC) RIGHT 9.53, 01:07.0;  Surgeon: Myrna Adine Anes, MD;  Location: [facility];  Service: Ophthalmology;  Laterality: Right;    Home Medications:  Allergies as of 03/06/2024       Reactions   Hydrochlorothiazide Other (See Comments)   GOUT FLARE UP        Medication List        Accurate as of March 05, 2024  1:08 PM. If you have any questions, ask your nurse or doctor.          amLODipine 10 MG tablet Commonly known as: NORVASC Take 10 mg by mouth daily.   aspirin EC 81 MG tablet Take 1 tablet by mouth daily.   atorvastatin 10 MG tablet Commonly known as: LIPITOR Take 10 mg by mouth daily.   carvedilol 12.5 MG tablet Commonly known as: COREG Take 12.5 mg by mouth 2 (two) times daily.   losartan 100 MG tablet Commonly known as: COZAAR Take 100 mg by mouth daily.   sildenafil  20 MG tablet Commonly known  as: REVATIO  Take 1 tablet (20 mg total) by mouth 3 (three) times daily.   spironolactone 25 MG tablet Commonly known as: ALDACTONE Take 25 mg by mouth daily.   tamsulosin  0.4 MG Caps capsule Commonly known as: FLOMAX  Take 1 capsule (0.4 mg total) by mouth daily.        Allergies:  Allergies  Allergen Reactions   Hydrochlorothiazide Other (See Comments)    GOUT FLARE UP    Family History: No family history on file.  Social History: See HPI for pertinent social history  ROS: Pertinent ROS in HPI  Physical Exam: There were no vitals taken for this visit.  Constitutional:  Well nourished. Alert and oriented, No acute distress. HEENT: [place] AT, moist mucus membranes.  Trachea midline, no masses. Cardiovascular: No clubbing, cyanosis, or edema. Respiratory: Normal respiratory effort, no increased work of breathing. GI: Abdomen is soft, non tender, non distended, no abdominal masses. Liver and spleen not palpable.  No hernias appreciated.  Stool sample for occult testing is not indicated.   GU: No CVA tenderness.  No bladder fullness or masses.  Patient with circumcised/uncircumcised phallus. ***Foreskin easily retracted***  Urethral meatus is patent.  No penile discharge. No penile lesions or rashes. Scrotum without lesions, cysts, rashes and/or edema.  Testicles are located scrotally  bilaterally. No masses are appreciated in the testicles. Left and right epididymis are normal. Rectal: Patient with  normal sphincter tone. Anus and perineum without scarring or rashes. No rectal masses are appreciated. Prostate is approximately *** grams, *** nodules are appreciated. Seminal vesicles are normal. Skin: No rashes, bruises or suspicious lesions. Lymph: No cervical or inguinal adenopathy. Neurologic: Grossly intact, no focal deficits, moving all 4 extremities. Psychiatric: Normal mood and affect.  Laboratory Data: See EPIC and HPI  I have reviewed the labs.   Pertinent  Imaging: ***  Assessment & Plan:  ***  1. Elevated PSA - free and total PSA pending   2. BPH with LU TS - stable, improving, worsening mild, moderate severe symptoms *** - no signs of retention, infection or malignancy *** - PSA pending  - DRE benign *** - UA benign *** - PVR < 300 cc *** - most bothersome symptoms are *** - encouraged avoiding bladder irritants, fluid restriction before bedtime and timed voiding's - Initiate alpha-blocker (***), discussed side effects *** - Initiate 5 alpha reductase inhibitor (***), discussed side effects *** - Continue tamsulosin  0.4 mg daily, alfuzosin 10 mg daily, Rapaflo 8 mg daily, terazosin, doxazosin, Cialis 5 mg daily and finasteride 5 mg daily, dutasteride 0.5 mg daily***:refills given - Cannot tolerate medication or medication failure, schedule cystoscopy *** - educated on red flag symptoms: acute retention, gross hematuria, fever, severe pain - advised to call clinic or go to the ED if these occur - return to clinic in *** symptom re-evaluation ***  3. Erectile dysfunction - I explained to the patient that in order to achieve an erection it takes good functioning of the nervous system (parasympathetic and rs, sympathetic, sensory and motor), good blood flow into the erectile tissue of the penis and a desire to have sex - I explained that conditions like diabetes, hypertension, coronary artery disease, peripheral vascular disease, smoking, alcohol consumption, age, sleep apnea and BPH can diminish the ability to have an erection - I explained the ED may be a risk marker for underlying CVD and he should follow up with PCP for further studies *** - we will obtain a serum testosterone level at this time; if it is abnormal we will need to repeat the study for confirmation *** - A recent study published in Sex Med 2018 Apr 13 revealed moderate to vigorous aerobic exercise for 40 minutes 4 times per week can decrease erectile problems caused by  physical inactivity, obesity, hypertension, metabolic syndrome and/or cardiovascular diseases *** - We discussed trying a *** different PDE5 inhibitor, intra-urethral suppositories, intracavernous vasoactive drug injection therapy, vacuum erection devices, LI-ESWT and penile prosthesis implantation   No follow-ups on file.  These notes generated with voice recognition software. I apologize for typographical errors.  Bruce Brady  [facility] [address]  [address] [place], [place] [address] [phone]

## 2024-03-06 ENCOUNTER — Telehealth: Payer: Self-pay | Admitting: Urology

## 2024-03-06 ENCOUNTER — Ambulatory Visit: Payer: Medicare HMO | Admitting: Urology

## 2024-03-06 ENCOUNTER — Ambulatory Visit: Admitting: Urology

## 2024-03-06 DIAGNOSIS — R972 Elevated prostate specific antigen [PSA]: Secondary | ICD-10-CM

## 2024-03-06 DIAGNOSIS — N138 Other obstructive and reflux uropathy: Secondary | ICD-10-CM

## 2024-03-06 DIAGNOSIS — N528 Other male erectile dysfunction: Secondary | ICD-10-CM

## 2024-03-06 NOTE — Telephone Encounter (Signed)
 He missed his follow-up appointment with me today.  It is important that he follows up because he has a high PSA and we need to keep an eye on that.  Please schedule him for a return appointment at my next available.

## 2024-03-07 NOTE — Telephone Encounter (Signed)
LMOM for pt to call office to schedule appt °

## 2024-03-07 NOTE — Progress Notes (Signed)
 03/08/2024 4:14 PM   ABRIEL GEESEY [birth year] [id number]  Referring provider: Buren Rock HERO, MD [address] [place],  [place] [address]  Urological history: 1.  Elevated PSA - PSA (06/2023) 5.2   2. 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 ED - Sildenafil  20 mg, on-demand dosing  Chief Complaint  Patient presents with   Elevated PSA   HPI: Bruce Brady is a 73 y.o. man who presents today for 6 months follow up for elevated PSA, BPH w/ LU TS and ED.  Previous records reviewed.   He is having some urge incontinence.   Patient denies any modifying or aggravating factors.  Patient denies any recent UTI's, gross hematuria, dysuria or suprapubic/flank pain.  Patient denies any fevers, chills, nausea or vomiting.    PVR 0 mL   He has not tried the sildenafil  because he is scared of the side effects.  He had taken a sildenafil  in the past and it caused some ringing in his ears.    He also mentions that his PSA was checked recently by his PCP and it had gone down significantly.  Their practice is not on epic and I do not have access to those results.   PMH: Past Medical History:  Diagnosis Date   Dilated aortic root (HCC)    Gout    H/O blood clots    Hypertension     Surgical History: Past Surgical History:  Procedure Laterality Date   CATARACT EXTRACTION W/PHACO Left 10/31/2023   Procedure: CATARACT EXTRACTION PHACO AND INTRAOCULAR LENS PLACEMENT (IOC) LEFT 7.79 00:54.7;  Surgeon: Myrna Adine Anes, MD;  Location: [facility];  Service: Ophthalmology;  Laterality: Left;   CATARACT EXTRACTION W/PHACO Right 11/14/2023   Procedure: CATARACT EXTRACTION PHACO AND INTRAOCULAR LENS PLACEMENT (IOC) RIGHT 9.53, 01:07.0;  Surgeon: Myrna Adine Anes, MD;  Location: [facility];  Service: Ophthalmology;  Laterality: Right;    Home Medications:  Allergies as of 03/08/2024       Reactions   Hydrochlorothiazide Other (See Comments)   GOUT FLARE  UP        Medication List        Accurate as of March 08, 2024  4:14 PM. If you have any questions, ask your nurse or doctor.          amLODipine 10 MG tablet Commonly known as: NORVASC Take 10 mg by mouth daily.   aspirin EC 81 MG tablet Take 1 tablet by mouth daily.   atorvastatin 10 MG tablet Commonly known as: LIPITOR Take 10 mg by mouth daily.   carvedilol 12.5 MG tablet Commonly known as: COREG Take 12.5 mg by mouth 2 (two) times daily.   losartan 100 MG tablet Commonly known as: COZAAR Take 100 mg by mouth daily.   sildenafil  20 MG tablet Commonly known as: REVATIO  Take 1 tablet (20 mg total) by mouth 3 (three) times daily.   spironolactone 25 MG tablet Commonly known as: ALDACTONE Take 25 mg by mouth daily.   tamsulosin  0.4 MG Caps capsule Commonly known as: FLOMAX  Take 1 capsule (0.4 mg total) by mouth daily.        Allergies:  Allergies  Allergen Reactions   Hydrochlorothiazide Other (See Comments)    GOUT FLARE UP    Family History: History reviewed. No pertinent family history.  Social History: See HPI for pertinent social history  ROS: Pertinent ROS in HPI  Physical Exam: BP 136/86 (BP  Location: Left Arm, Patient Position: Sitting, Cuff Size: Large)   Pulse 93   Ht 5' 9 (1.753 m)   Wt 162 lb 1.6 oz (73.5 kg)   BMI 23.94 kg/m   Constitutional:  Well nourished. Alert and oriented, No acute distress. HEENT: [place] AT, moist mucus membranes.  Trachea midline, no masses. Cardiovascular: No clubbing, cyanosis, or edema. Respiratory: Normal respiratory effort, no increased work of breathing. GI: Abdomen is soft, non tender, non distended, no abdominal masses. Liver and spleen not palpable.  No hernias appreciated.  Stool sample for occult testing is not indicated.   GU: No CVA tenderness.  No bladder fullness or masses.  Patient with uncircumcised phallus.  Foreskin easily retracted.  Urethral meatus is patent.  No penile discharge. No  penile lesions or rashes. Scrotum without lesions, cysts, rashes and/or edema.  Testicles are located scrotally bilaterally. No masses are appreciated in the testicles. Left and right epididymis are normal. Rectal: Patient with  normal sphincter tone. Anus and perineum without scarring or rashes. No rectal masses are appreciated. Prostate is approximately 50 + grams, could not palpate the entire gland, no nodules are appreciated. Seminal vesicles could not be palpated.   Neurologic: Grossly intact, no focal deficits, moving all 4 extremities. Psychiatric: Normal mood and affect.  Laboratory Data: See EPIC and HPI  I have reviewed the labs.   Pertinent Imaging:  03/08/24 15:47  Scan Result 0ml    Assessment & Plan:    1. Elevated PSA - free and total PSA pending   2. BPH with LU TS - stable mild symptoms  - free and total PSA pending  - DRE benign  - PVR < 300 cc  - most bothersome symptoms are urge incontinence  - encouraged avoiding bladder irritants, fluid restriction before bedtime and timed voiding's - We discussed starting a medication to address his mild urge incontinence, but he deferred - educated on red flag symptoms: acute retention, gross hematuria, fever, severe pain - advised to call clinic or go to the ED if these occur - return to clinic in 6 months symptom re-evaluation   3. Erectile dysfunction - He is hesitant to take medication for erectile dysfunction as he is concerned about the side effects, I offered him a trial with tadalafil, but he deferred stating he has 73 years old and sex is just not that important to him at this time  Return in about 6 months (around 09/08/2024) for free and total PSA/DRE .  These notes generated with voice recognition software. I apologize for typographical errors.  CLOTILDA HELON RIGGERS  [facility] [address]  [address] [place], [place] [address] [phone]

## 2024-03-08 ENCOUNTER — Encounter: Payer: Self-pay | Admitting: Urology

## 2024-03-08 ENCOUNTER — Ambulatory Visit: Admitting: Urology

## 2024-03-08 VITALS — BP 136/86 | HR 93 | Ht 69.0 in | Wt 162.1 lb

## 2024-03-08 DIAGNOSIS — N401 Enlarged prostate with lower urinary tract symptoms: Secondary | ICD-10-CM

## 2024-03-08 DIAGNOSIS — N528 Other male erectile dysfunction: Secondary | ICD-10-CM | POA: Diagnosis not present

## 2024-03-08 DIAGNOSIS — N138 Other obstructive and reflux uropathy: Secondary | ICD-10-CM | POA: Diagnosis not present

## 2024-03-08 DIAGNOSIS — R972 Elevated prostate specific antigen [PSA]: Secondary | ICD-10-CM | POA: Diagnosis not present

## 2024-03-08 LAB — BLADDER SCAN AMB NON-IMAGING

## 2024-03-09 ENCOUNTER — Ambulatory Visit: Payer: Self-pay | Admitting: Urology

## 2024-03-09 DIAGNOSIS — N138 Other obstructive and reflux uropathy: Secondary | ICD-10-CM

## 2024-03-09 DIAGNOSIS — R972 Elevated prostate specific antigen [PSA]: Secondary | ICD-10-CM

## 2024-03-09 LAB — PSA: Prostate Specific Ag, Serum: 7.3 ng/mL — ABNORMAL HIGH (ref 0.0–4.0)

## 2024-03-10 LAB — SPECIMEN STATUS REPORT

## 2024-03-10 LAB — PSA TOTAL (REFLEX TO FREE): Prostate Specific Ag, Serum: 6.2 ng/mL — ABNORMAL HIGH (ref 0.0–4.0)

## 2024-03-10 LAB — FPSA% REFLEX
% FREE PSA: 8.2 %
PSA, FREE: 0.51 ng/mL

## 2024-03-27 ENCOUNTER — Ambulatory Visit
Admission: RE | Admit: 2024-03-27 | Discharge: 2024-03-27 | Disposition: A | Discharge status: Home / Self Care | Source: Ambulatory Visit | Attending: Urology | Admitting: Urology

## 2024-03-27 DIAGNOSIS — N138 Other obstructive and reflux uropathy: Secondary | ICD-10-CM | POA: Insufficient documentation

## 2024-03-27 DIAGNOSIS — N401 Enlarged prostate with lower urinary tract symptoms: Secondary | ICD-10-CM | POA: Insufficient documentation

## 2024-03-27 DIAGNOSIS — R972 Elevated prostate specific antigen [PSA]: Secondary | ICD-10-CM | POA: Diagnosis present

## 2024-03-27 MED ORDER — GADOBUTROL 1 MMOL/ML IV SOLN
7.0000 mL | Freq: Once | INTRAVENOUS | Status: AC | PRN
  Administered 2024-03-27: 7 mL via INTRAVENOUS

## 2024-04-02 NOTE — Progress Notes (Unsigned)
 04/03/2024 4:10 PM   HARDIN HARDENBROOK 1951/01/11 [id number]  Referring provider: Buren Rock HERO, MD [address] [place],  [place] [address]  Urological history: 1.  Elevated PSA - PSA (02/2024) 6.2  2. 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 ED - Sildenafil  20 mg, on-demand dosing  Chief Complaint  Patient presents with   Follow-up   Elevated PSA   HPI: Bruce Brady is a 73 y.o. man who presents today for discussion regarding his Prostate MRI results.    Previous records reviewed.   He was seen by Dr. Penne in February for an elevated PSA in December 2024 of 5.2.  He denies any family history of prostate cancer, but stated his sister was diagnosed with breast cancer at the age of 64.  He was also started on tamsulosin  for BPH and prescribed sildenafil  for ED.  When I saw him back in August, I obtained a free and total PSA.  Total PSA was 6.2, free PSA was 0.51, % PSA was 8.2 making the probability of him having prostate cancer at 55%.  I then recommended a prostate MRI for further evaluation.  Prostate MRI completed on 03/27/2024 revealed a prostate volume of 23 cc making his PSA density 0.27.   A PI-RADS 4 lesion was identified in the left lateral mid gland.   PMH: Past Medical History:  Diagnosis Date   Dilated aortic root (HCC)    Gout    H/O blood clots    Hypertension     Surgical History: Past Surgical History:  Procedure Laterality Date   CATARACT EXTRACTION W/PHACO Left 10/31/2023   Procedure: CATARACT EXTRACTION PHACO AND INTRAOCULAR LENS PLACEMENT (IOC) LEFT 7.79 00:54.7;  Surgeon: Myrna Adine Anes, MD;  Location: [facility];  Service: Ophthalmology;  Laterality: Left;   CATARACT EXTRACTION W/PHACO Right 11/14/2023   Procedure: CATARACT EXTRACTION PHACO AND INTRAOCULAR LENS PLACEMENT (IOC) RIGHT 9.53, 01:07.0;  Surgeon: Myrna Adine Anes, MD;  Location: [facility];  Service: Ophthalmology;  Laterality: Right;    Home  Medications:  Allergies as of 04/03/2024       Reactions   Hydrochlorothiazide Other (See Comments)   GOUT FLARE UP        Medication List        Accurate as of April 03, 2024  4:10 PM. If you have any questions, ask your nurse or doctor.          amLODipine 10 MG tablet Commonly known as: NORVASC Take 10 mg by mouth daily.   aspirin EC 81 MG tablet Take 1 tablet by mouth daily.   atorvastatin 10 MG tablet Commonly known as: LIPITOR Take 10 mg by mouth daily.   carvedilol 12.5 MG tablet Commonly known as: COREG Take 12.5 mg by mouth 2 (two) times daily.   losartan 100 MG tablet Commonly known as: COZAAR Take 100 mg by mouth daily.   sildenafil  20 MG tablet Commonly known as: REVATIO  Take 1 tablet (20 mg total) by mouth 3 (three) times daily.   spironolactone 25 MG tablet Commonly known as: ALDACTONE Take 25 mg by mouth daily.   tamsulosin  0.4 MG Caps capsule Commonly known as: FLOMAX  Take 1 capsule (0.4 mg total) by mouth daily.        Allergies:  Allergies  Allergen Reactions   Hydrochlorothiazide Other (See Comments)    GOUT FLARE UP    Family History: No family history on file.  Social History:  See HPI for pertinent social history  ROS: Pertinent ROS in HPI  Physical Exam: BP 132/84   Pulse 94   Ht 5' 9 (1.753 m)   Wt 206 lb 9.6 oz (93.7 kg)   BMI 30.51 kg/m   Constitutional:  Well nourished. Alert and oriented, No acute distress. HEENT: [place] AT, moist mucus membranes.  Trachea midline Cardiovascular: No clubbing, cyanosis, or edema. Respiratory: Normal respiratory effort, no increased work of breathing. Neurologic: Grossly intact, no focal deficits, moving all 4 extremities. Psychiatric: Normal mood and affect.   Laboratory Data: See EPIC and HPI  I have reviewed the labs.   Pertinent Imaging: CLINICAL DATA:  Elevated PSA.  73 year old male.   EXAM: MR PROSTATE WITHOUT AND WITH CONTRAST   TECHNIQUE: Multiplanar  multisequence MRI images were obtained of the pelvis centered about the prostate. Pre and post contrast images were obtained.   CONTRAST:  7mL GADAVIST  GADOBUTROL  1 MMOL/ML IV SOLN   COMPARISON:  None Available.   FINDINGS: Prostate: There is heterogeneous signal intensity with upper lobes on T2 weighted imaging. There is a region of more focal low signal intensity in the LEFT lateral mid gland measuring 8 mm on image 15/5. There is a corresponding focus of diffusion measuring 9 mm on image 13/series 8 and series 9)   No additional suspicious lesions within the peripheral zone.   Postcontrast T1 weighted imaging demonstrates enhancement through the LEFT lateral mid gland.   The transitional zone is minimally enlarged by capsulated nodules without suspicious imaging characteristics on T2 weighted imaging.   Volume: 4.0 x 2.9 x 3.8 cm vol = 23 cc   Transcapsular spread:  Absent   Seminal vesicle involvement: Absent   Neurovascular bundle involvement: Absent   Pelvic adenopathy: Absent   Bone metastasis: Absent   Other findings: None   IMPRESSION: 1. Lesion in the LEFT lateral mid gland is concerning for high-grade prostate adenocarcinoma. PI-RADS (v2.1): 4. ROI # 1. 2. Minimally enlarged nodular transitional zone most consistent with benign prostate hypertrophy. PI-RADS (v2.1): 2.     Electronically Signed   By: Jackquline Boxer M.D.   On: 03/30/2024 16:29 I have independently reviewed the films.  See HPI.    Assessment & Plan:    1. Elevated PSA/PI-RADS 4 lesion -Explained that PI-RADS 4 lesions on prostate MRI have 50% probability of being clinically significant prostate cancer and recommend a fusion prostate biopsy -Explained the procedure is explained and the risks involved, such as blood in urine, blood in stool, blood in semen, infection, urinary retention, and on rare occasions sepsis and death.  Patient understands the risks as explained to him and he wishes  to proceed.  Patient is on ASA and is advised to that he will need to discontinue the medication ten days prior to the biopsy.  [facility] will get [facility] from [facility].   2. BPH with LU TS - stable mild symptoms  - DRE benign  - PVR < 300 cc  - most bothersome symptoms are urge incontinence  - encouraged avoiding bladder irritants, fluid restriction before bedtime and timed voiding's - We discussed starting a medication to address his mild urge incontinence, but he deferred - educated on red flag symptoms: acute retention, gross hematuria, fever, severe pain - advised to call clinic or go to the ED if these occur  3. Erectile dysfunction - He is hesitant to take medication for erectile dysfunction as he is concerned about the side effects, I offered him a  trial with tadalafil, but he deferred stating he has 73 years old and sex is just not that important to him at this time  Return for fusion biopsy of prostate - needs clearance to stop ASA .  These notes generated with voice recognition software. I apologize for typographical errors.  CLOTILDA HELON RIGGERS  [facility] [address]  [address] [place], [place] [address] [phone]

## 2024-04-03 ENCOUNTER — Ambulatory Visit: Admitting: Urology

## 2024-04-03 ENCOUNTER — Encounter: Payer: Self-pay | Admitting: Urology

## 2024-04-03 VITALS — BP 132/84 | HR 94 | Ht 69.0 in | Wt 206.6 lb

## 2024-04-03 DIAGNOSIS — R9389 Abnormal findings on diagnostic imaging of other specified body structures: Secondary | ICD-10-CM

## 2024-04-03 DIAGNOSIS — R972 Elevated prostate specific antigen [PSA]: Secondary | ICD-10-CM

## 2024-04-03 NOTE — Patient Instructions (Signed)
 Bruce Brady

## 2024-04-04 ENCOUNTER — Ambulatory Visit

## 2024-04-05 ENCOUNTER — Telehealth: Payer: Self-pay

## 2024-04-05 NOTE — Telephone Encounter (Signed)
 Called patient and didn't get a response. I left a voice mail for him to call us  back. He needs to schedule a appointment with his cardiologist so he can get clearance for his fusion biopsy or we will have to re-schedule.~Madelynne Lasker,CMA

## 2024-04-05 NOTE — Telephone Encounter (Signed)
 Called patient to see if he had a follow up appointment with his cardiologist. Pt said he did on 04/30/24. He said he stopped taking aspirin but I let him know we still needed a clearance from his cardiologist. I also informed pt that I would need to schedule his fusion biopsy from 04/25/24 to 05/16/24. Pt understood and had no further questions. Quintrell Baze,CMA.

## 2024-04-25 ENCOUNTER — Other Ambulatory Visit: Admitting: Urology

## 2024-05-07 NOTE — Progress Notes (Addendum)
 Cardiology Office Note  Date:  05/08/2024   ID:  Bruce Brady, DOB [birth year], MRN [id number]  PCP:  Buren Rock HERO, MD   Chief Complaint  Patient presents with   New Patient (Initial Visit)    Patient was seen by [facility] for a cardiac clearance prior to (BIOPSY) prostate surgery. The patient is transferring care to [facility]. The patient will have the prostate surgery at [facility] in [place].  Patient denies chest pain or shortness of breath.     HPI:  Bruce Brady a 73 y.o. 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 Rock Buren for aorta dilation, hypertension  Seen by [facility] cardiology last April 30, 2024 Scheduled for biopsy for prostate cancer Elevated PSA  Blood pressure well-controlled on amlodipine 10  losartan 100 per [facility] Previously on carvedilol and spironolactone  On follow-up phone calls after his visit he reported he was not taking carvedilol, spironolactone, sildenafil   Prior imaging reviewed Ascending aorta 4.3 cm on MRI chest August 2023 Unchanged from 4/17, aorta 4.3 cm  Echo October 2022 normal left ventricular function, mild dilation of aorta estimated 3.8 cm  Sedentary at baseline, or few hobbies, watches TV Lives with his wife No regular exercise program  Denies chest pain or shortness of breath on exertion No leg swelling, no PND orthopnea  EKG personally reviewed by myself on todays visit EKG Interpretation Date/Time:  Tuesday May 08 2024 15:06:37 EDT Ventricular Rate:  81 PR Interval:  168 QRS Duration:  102 QT Interval:  396 QTC Calculation: 460 R Axis:   45  Text Interpretation: Normal sinus rhythm Low voltage QRS Nonspecific T wave abnormality Prolonged QT When compared with ECG of 24-Nov-1999 09:05, Premature atrial complexes are no longer Present Vent. rate has increased BY  32 BPM Nonspecific T wave  abnormality now evident in Inferior leads Nonspecific T wave abnormality, worse in Lateral leads QT has lengthened Confirmed by Josimar Corning [phone]) on 05/08/2024 3:16:14 PM    PMH:   has a past medical history of Dilated aortic root, Gout, H/O blood clots, and Hypertension.  PSH:    Past Surgical History:  Procedure Laterality Date   CATARACT EXTRACTION W/PHACO Left 10/31/2023   Procedure: CATARACT EXTRACTION PHACO AND INTRAOCULAR LENS PLACEMENT (IOC) LEFT 7.79 00:54.7;  Surgeon: Myrna Adine Anes, MD;  Location: [facility];  Service: Ophthalmology;  Laterality: Left;   CATARACT EXTRACTION W/PHACO Right 11/14/2023   Procedure: CATARACT EXTRACTION PHACO AND INTRAOCULAR LENS PLACEMENT (IOC) RIGHT 9.53, 01:07.0;  Surgeon: Myrna Adine Anes, MD;  Location: [facility];  Service: Ophthalmology;  Laterality: Right;    Current Outpatient Medications  Medication Sig Dispense Refill   amLODipine (NORVASC) 10 MG tablet Take 10 mg by mouth daily.     atorvastatin (LIPITOR) 10 MG tablet Take 10 mg by mouth daily.     losartan (COZAAR) 100 MG tablet Take 100 mg by mouth daily.     tamsulosin  (FLOMAX ) 0.4 MG CAPS capsule Take 1 capsule (0.4 mg total) by mouth daily. 30 capsule 11   aspirin EC 81 MG tablet Take 1 tablet by mouth daily. (Patient not taking: Reported on 05/08/2024)     No current facility-administered medications for this visit.     Allergies:   Hydrochlorothiazide   Social History:  The patient  reports that he has never smoked. He has never used smokeless tobacco. He  reports current alcohol use. He reports that he does not use drugs.   Family History:   family history includes Heart attack in his mother; Heart attack (age of onset: 56) in his brother.    Review of Systems: Review of Systems  Constitutional: Negative.   HENT: Negative.    Respiratory: Negative.    Cardiovascular: Negative.   Gastrointestinal: Negative.   Musculoskeletal: Negative.    Neurological: Negative.   Psychiatric/Behavioral: Negative.    All other systems reviewed and are negative.  PHYSICAL EXAM: VS:  BP 120/72 (BP Location: Right Arm, Patient Position: Sitting, Cuff Size: Large)   Pulse 81   Ht 5' 9 (1.753 m)   Wt 207 lb (93.9 kg)   SpO2 98%   BMI 30.57 kg/m  , BMI Body mass index is 30.57 kg/m. GEN: Well nourished, well developed, in no acute distress HEENT: normal Neck: no JVD, carotid bruits, or masses Cardiac: RRR; no murmurs, rubs, or gallops,no edema  Respiratory:  clear to auscultation bilaterally, normal work of breathing GI: soft, nontender, nondistended, + BS MS: no deformity or atrophy Skin: warm and dry, no rash Neuro:  Strength and sensation are intact Psych: euthymic mood, full affect  Recent Labs: No results found for requested labs within last 365 days.    Lipid Panel No results found for: CHOL, HDL, LDLCALC, TRIG    Wt Readings from Last 3 Encounters:  05/08/24 207 lb (93.9 kg)  04/03/24 206 lb 9.6 oz (93.7 kg)  03/08/24 162 lb 1.6 oz (73.5 kg)     ASSESSMENT AND PLAN:  Problem List Items Addressed This Visit       Cardiology Problems   Ascending aorta dilation   Relevant Orders   EKG 12-Lead (Completed)   Hyperlipidemia   LVH (left ventricular hypertrophy)   Relevant Orders   EKG 12-Lead (Completed)     Other   Prediabetes - Primary   Preop cardiovascular evaluation Acceptable risk for prostate biopsy He can stop aspirin 5 days prior to procedure, he reports that he has already stopped aspirin  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 No changes made to the medications. On follow-up phone calls he  reports not taking carvedilol and spironolactone Blood pressure 120/70 on his office visit  Hyperlipidemia No recent lipid panel available Managed by primary care On Lipitor 10 daily    Signed, Tim Richardine Peppers, M.D., Ph.D. [facility] [place], [place] [phone]

## 2024-05-08 ENCOUNTER — Encounter: Payer: Self-pay | Admitting: Cardiovascular Disease

## 2024-05-08 ENCOUNTER — Ambulatory Visit: Attending: Cardiovascular Disease | Admitting: Cardiovascular Disease

## 2024-05-08 VITALS — BP 120/72 | HR 81 | Ht 69.0 in | Wt 207.0 lb

## 2024-05-08 DIAGNOSIS — E782 Mixed hyperlipidemia: Secondary | ICD-10-CM

## 2024-05-08 DIAGNOSIS — I77810 Thoracic aortic ectasia: Secondary | ICD-10-CM

## 2024-05-08 DIAGNOSIS — R7303 Prediabetes: Secondary | ICD-10-CM

## 2024-05-08 DIAGNOSIS — I517 Cardiomegaly: Secondary | ICD-10-CM | POA: Diagnosis not present

## 2024-05-08 NOTE — Patient Instructions (Addendum)
 Medication Instructions:   No changes  Check and see if you are taking carvedilol/coreg  If you need a refill on your cardiac medications before your next appointment, please call your pharmacy.   Lab work: No new labs needed  Testing/Procedures: No new testing needed  Follow-Up: At [facility], you and your health needs are our priority.  As part of our continuing mission to provide you with exceptional heart care, we have created designated Provider Care Teams.  These Care Teams include your primary Cardiologist (physician) and Advanced Practice Providers (APPs -  Physician Assistants and Nurse Practitioners) who all work together to provide you with the care you need, when you need it.  You will need a follow up appointment in 12 months  Providers on your designated Care Team:   Lonni Meager, NP Bernardino Bring, PA-C Cadence [place], [place]  COVID-19 Vaccine Information can be found at: [email or web address] For questions related to vaccine distribution or appointments, please email [email or web address] or call 8187607609.

## 2024-05-16 ENCOUNTER — Telehealth: Payer: Self-pay | Admitting: Cardiovascular Disease

## 2024-05-16 ENCOUNTER — Other Ambulatory Visit: Admitting: Urology

## 2024-05-16 NOTE — Telephone Encounter (Signed)
 Pt said Dr. Gollan wanted to know what medications he is not taking   Carvedilol 12.5 mg,   Sildenafil  20 mg Spironolactone 25 mg

## 2024-05-17 NOTE — Telephone Encounter (Signed)
 Medication list updated.

## 2024-05-17 NOTE — Progress Notes (Signed)
   05/17/24  Indication:  PSA 6.2 (Aug 2025), %free 8.2 MRI (03/27/24) - PIRADS 4 at Left lateral mid PZ   - 23g gland  MRI Fusion Prostate Biopsy Procedure:   Informed consent was obtained, and we discussed the risks of bleeding and infection/sepsis. A time out was performed to ensure correct patient identity.  Pre-Procedure: - Last PSA Level: PSA 6.2 (Aug 2025), %free 8.2 - Gentamicin and levaquin given for antibiotic prophylaxis - MRI images were reviewed, targets mapped and uploaded via Dynacad   - Prostate volume by MRI: 23 cc  ROI 1: PIRADS 4 a Left lateral mid PZ  Procedure: - Periprostatic nerve block performed using 20 cc 1% lidocaine   - Anatomic measurements obtained revealing a 27 gm prostate, PSA density 0.23 - No significant hypoechoic lesions or median lobe noted  Next, the MRI images were registered to real-time ultrasound using UroNav fusion software with accurate contour alignment. We noted 1 target lesions. Three biopsy cores were obtained from each target lesion. Next, we obtained an additional 12-core systematic biopsy template.   Total biopsy cores: 12 systematic + 3 cores RO1 = 15 cores  Post-Procedure: - Patient tolerated the procedure well - He was counseled to seek immediate medical attention if experiences significant bleeding, fevers, urinary retention, or severe pain - Return in one week to discuss biopsy results  Assessment/ Plan: Will follow up in ~7-10 days to discuss pathology  Penne Skye, MD 05/17/2024

## 2024-05-22 NOTE — Progress Notes (Unsigned)
 05/30/2024 12:31 PM   Bruce Brady [birth year] [id number]  Reason for visit: Follow up prostate bx results   HPI: 73 y.o. male, follow up with me today S/p MRI fusion bx (05/23/24) - GS 4+3=7 in 1/13 cores (~65% involvement), GS 3+4=7 in 7/13 cores  Prior HPI: PSA 6.2 (Aug 2025), %free 8.2 MRI (03/27/24) - PIRADS 4 at Left lateral mid PZ   - 23g gland  Hx of BPH on Flomax  Hx of ED on sildenafil  20mg  PRN No Fhx of CaP, Fhx of breast Ca in sister   Physical Exam: There were no vitals taken for this visit.   Constitutional:  Alert and oriented, No acute distress.  Laboratory Data: Location                #Pcs   Length(mm)   Gross Desc    Gleason %Invl TumLth  (A):Left,Base              1   20 mm        Tan-white     3+4=7    38%      7  (B):Left,Mid               1   15 mm        Tan-white     3+4=7    10%   1.51  (C):Left,Apex              2   1 to 17 mm   Tan-white     3+4=7    17%    n/a  (D):Right,Base             2   2 to 15 mm   Tan-white     n/a      n/a    n/a  (E):Right,Mid              1   17 mm        Tan-white     n/a      n/a    n/a  (F):Right,Apex             1   18 mm        Tan-white     3+4=7    25%    4.5  (G):L,Lat Base             1   27 mm        Tan-white     3+4=7    10%      2  (H):L,Lat Mid              3   0.5 to 18 mm Tan-white     3+4=7    55%    n/a  (I):L,Lat Apex             1   20 mm        Tan-white     3+4=7     8%   1.61  (J):R,Lat Base             2   1 to 17 mm   Tan-white     n/a      n/a    n/a  (K):R,Lat Mid              1   20 mm        Tan-white     n/a      n/a    n/a  (  L):R,Lat Apex             1   24 mm        Tan-white     n/a      n/a    n/a  (M):ROI,#1                 3   13  to 15 mm  Tan-white     4+3=7    65%    n/a    Pertinent Imaging: N/A    Assessment & Plan:    Prostate cancer [facility]) Assessment & Plan: 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  We reviewed his prior workup, lab data, and biopsy results. Using this information, and comparing against thousands of men who have come before him, with similar diagnostic findings, we are able to risk-stratify his disease and offer the safest and most effective treatment option. AUA risk stratifications include very low risk, low risk, intermediate risk, and high risk disease. He meets criteria for unfavorable intermediate risk . I explained the need for additional staging with unfavorable intermediate risk and high risk groups, including conventional CT scans, bone scans, and newer modalities such as PSMA/PET. I explained that his life expectancy, clinical stage, Gleason score, PSA, and relevant co-morbidities influence our treatment strategies and options. We discussed the roles of active surveillance versus definitive therapies, in the form of radiation or surgery. A treatment should be carefully selected weighing all factors, including patient preference, with a goal to minimize long-term morbidity while preserving cancer-free mortality. For definitive treatments, I reviewed the comparable oncologic outcomes regarding radiation and surgery-- each with their attendant risk profiles and timelines. I emphasized that patients with baseline urinary or lower colonic symptoms may be impacted by treatment strategy, as patients with severe lower urinary tract symptoms may have significant worsening or even develop urinary retention after undergoing radiation.  In regards to surgery, we discussed the robotic radical prostatectomy +/- lymphadenectomy at length. The procedure takes 3 to 4 hours under general anesthesia, with expected discharge home on post-op day #1.  A Foley catheter is left in place for 7 to 10 days  to allow for healing of the vesicourethral anastomosis. There is a small risk of bleeding, infection, damage to surrounding structures or bowel, hernia, DVT/PE, or serious cardiac or pulmonary complications.  We discussed post-op side effects including erectile dysfunction, and the importance of pre-operative erectile function on long-term outcomes.  Even with a nerve sparing approach, there is an approximately 25% rate of permanent erectile dysfunction. We also discussed postop urinary incontinence at length.  We expect patients to have stress incontinence post-operatively that slowly improves over a period of weeks to months. Less than 10% of men will require a pad at 1 year after surgery. Pelvic floor strengthening and rehabilitation is strongly recommended to improve these outcomes. Patients will need to avoid heavy lifting and strenuous activity for 3 to 4 weeks, but most men return to their baseline activity status by 6 weeks.   - Rad/onc evaluation - RTC in ~3-4 weeks for follow up, review treatment decision        Penne JONELLE Skye, MD  [facility] [address], [address] [place], [place] [address] [phone]

## 2024-05-23 ENCOUNTER — Ambulatory Visit: Admitting: Urology

## 2024-05-23 ENCOUNTER — Other Ambulatory Visit: Admitting: Urology

## 2024-05-23 VITALS — BP 136/90 | HR 98

## 2024-05-23 DIAGNOSIS — N4231 Prostatic intraepithelial neoplasia: Secondary | ICD-10-CM

## 2024-05-23 DIAGNOSIS — Z792 Long term (current) use of antibiotics: Secondary | ICD-10-CM

## 2024-05-23 DIAGNOSIS — R972 Elevated prostate specific antigen [PSA]: Secondary | ICD-10-CM

## 2024-05-23 DIAGNOSIS — C61 Malignant neoplasm of prostate: Secondary | ICD-10-CM | POA: Diagnosis not present

## 2024-05-23 DIAGNOSIS — Z2989 Encounter for other specified prophylactic measures: Secondary | ICD-10-CM

## 2024-05-23 MED ORDER — GENTAMICIN SULFATE 40 MG/ML IJ SOLN
80.0000 mg | Freq: Once | INTRAMUSCULAR | Status: AC
  Administered 2024-05-23: 80 mg via INTRAMUSCULAR

## 2024-05-23 MED ORDER — LEVOFLOXACIN 500 MG PO TABS
500.0000 mg | ORAL_TABLET | Freq: Once | ORAL | Status: AC
  Administered 2024-05-23: 500 mg via ORAL

## 2024-05-23 MED ORDER — LEVOFLOXACIN 500 MG PO TABS: 500.0000 mg | ORAL_TABLET | Freq: Once | ORAL | Status: DC

## 2024-05-23 MED ORDER — GENTAMICIN SULFATE 40 MG/ML IJ SOLN: 60.0000 mg | Freq: Once | INTRAMUSCULAR | Status: DC

## 2024-05-23 MED ORDER — LIDOCAINE HCL URETHRAL/MUCOSAL 2 % EX GEL
1.0000 | Freq: Once | CUTANEOUS | Status: AC
  Administered 2024-05-23: 1 via URETHRAL

## 2024-05-23 MED ORDER — LIDOCAINE HCL URETHRAL/MUCOSAL 2 % EX GEL: 1.0000 | Freq: Once | CUTANEOUS | Status: DC

## 2024-05-23 NOTE — Patient Instructions (Signed)

## 2024-05-29 DIAGNOSIS — C61 Malignant neoplasm of prostate: Secondary | ICD-10-CM | POA: Insufficient documentation

## 2024-05-29 LAB — PROSTATE CORE NEEDLE BIOPSY

## 2024-05-29 NOTE — Assessment & Plan Note (Signed)
 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  We reviewed his prior workup, lab data, and biopsy results. Using this information, and comparing against thousands of men who have come before him, with similar diagnostic findings, we are able to risk-stratify his disease and offer the safest and most effective treatment option. AUA risk stratifications include very low risk, low risk, intermediate risk, and high risk disease. He meets criteria for unfavorable intermediate risk . I explained the need for additional staging with unfavorable intermediate risk and high risk groups, including conventional CT scans, bone scans, and newer modalities such as PSMA/PET. I explained that his life expectancy, clinical stage, Gleason score, PSA, and relevant co-morbidities influence our treatment strategies and options. We discussed the roles of active surveillance versus definitive therapies, in the form of radiation or surgery. A treatment should be carefully selected weighing all factors, including patient preference, with a goal to minimize long-term morbidity while preserving cancer-free mortality. For definitive treatments, I reviewed the comparable oncologic outcomes regarding radiation and surgery-- each with their attendant risk profiles and timelines. I emphasized that patients with baseline urinary or lower colonic symptoms may be impacted by treatment strategy, as patients with severe lower urinary tract symptoms may have significant worsening or even develop urinary retention after undergoing radiation.  In regards to surgery, we discussed the robotic radical prostatectomy +/- lymphadenectomy at length. The procedure takes 3 to 4 hours under general  anesthesia, with expected discharge home on post-op day #1.  A Foley catheter is left in place for 7 to 10 days to allow for healing of the vesicourethral anastomosis. There is a small risk of bleeding, infection, damage to surrounding structures or bowel, hernia, DVT/PE, or serious cardiac or pulmonary complications.  We discussed post-op side effects including erectile dysfunction, and the importance of pre-operative erectile function on long-term outcomes.  Even with a nerve sparing approach, there is an approximately 25% rate of permanent erectile dysfunction. We also discussed postop urinary incontinence at length.  We expect patients to have stress incontinence post-operatively that slowly improves over a period of weeks to months. Less than 10% of men will require a pad at 1 year after surgery. Pelvic floor strengthening and rehabilitation is strongly recommended to improve these outcomes. Patients will need to avoid heavy lifting and strenuous activity for 3 to 4 weeks, but most men return to their baseline activity status by 6 weeks.   - Rad/onc evaluation - RTC in ~3-4 weeks for follow up, review treatment decision

## 2024-05-30 ENCOUNTER — Other Ambulatory Visit: Payer: Self-pay

## 2024-05-30 ENCOUNTER — Ambulatory Visit: Admitting: Urology

## 2024-05-30 VITALS — BP 135/88 | HR 104 | Wt 207.0 lb

## 2024-05-30 DIAGNOSIS — C61 Malignant neoplasm of prostate: Secondary | ICD-10-CM

## 2024-05-30 NOTE — Patient Instructions (Signed)
 We reviewed your diagnosis of prostate cancer today. We recognize this is a lot of information, and it is normal to feel overwhelmed. Please know you are not alone, and we are here to support you through each step.  You are welcome to contact our office with follow-up questions or to schedule another visit with Dr. Georganne. Trusted resources include NCCN (TuxService.com.cy), [email or web address].org/ and the Prostate Cancer Foundation (sistemancia.com). Support groups and survivorship information are available through our clinic and local cancer support services.  Taking time to process this news is important--lean on your family, friends, and our care team as needed. We will work together to make the best plan for your health and quality of life.

## 2024-05-30 NOTE — Progress Notes (Signed)
 Surgical Physician Order Form [facility]  Dr. Penne Skye, MD  * Scheduling expectation : Next Available  *Length of Case: 4 hours  *Clearance needed: no  *Anticoagulation Instructions: N/A  *Aspirin Instructions: Hold Aspirin  *Post-op visit Date/Instructions:  1 week cath removal  *Diagnosis: Prostate Cancer  *Procedure: RALP + BPLND   Additional orders: N/A  -Admit type: Observation  -Anesthesia: General  -VTE Prophylaxis Standing Order SCD's       Other:   -Standing Lab Orders Per Anesthesia    Lab other: CBC, BMP, coag panel, type and screen UA/UCx  -Standing Test orders EKG/Chest x-ray per Anesthesia       Test other:   - Medications:  Ancef 2gm IV  -Other orders:  N/A

## 2024-07-06 ENCOUNTER — Telehealth: Payer: Self-pay

## 2024-07-06 NOTE — Progress Notes (Signed)
° °   [facility] Surgical Posting Form  Surgery Date: Date: 08/07/2024  Surgeon: Dr. Penne Skye, MD  Inpt ( No  )   Outpt (No)   Obs ( Yes  )   Diagnosis: C61 Prostate Cancer  -CPT: [phone], [phone]  Surgery: Robotic Laparoscopic Radical Prostatectomy with Bilateral Pelvic Lymph Node Dissection  Stop Anticoagulations: Yes and also hold ASA  Cardiac/Medical/Pulmonary Clearance needed: yes  Clearance needed from Dr: Burlene, MD with [facility]  Clearance request sent on: Date: 07/06/2024  *Orders entered into EPIC  Date: 07/06/2024   *Case booked in EPIC  Date: 07/06/2024  *Notified pt of Surgery: Date: 07/06/2024  PRE-OP UA & CX: yes, and will also obtain, CBC, BMP, PT, PTT, INR, Type and Screen  *Placed into Prior Authorization Work [place] Date: 07/06/2024  Assistant/laser/rep:No

## 2024-07-06 NOTE — Telephone Encounter (Signed)
 Per Dr. Georganne, Patient is to be scheduled for Robotic Laparoscopic Radical Prostatectomy with Bilateral Pelvic Lymph Node Dissection   Mr. Bruce Brady was contacted and possible surgical dates were discussed, Tuesday January 13th, 2026 was agreed upon for surgery.   Patient was directed to call [phone] between 1-3pm the day before surgery to find out surgical arrival time.  Instructions were given not to eat or drink from midnight on the night before surgery and have a driver for the day of surgery. On the surgery day patient was instructed to enter through the Medical Mall entrance of [facility] report the Same Day Surgery desk.   Pre-Admit Testing will be in contact via phone to set up an interview with the anesthesia team to review your history and medications prior to surgery.   Reminder of this information was sent via Mail to the patient.

## 2024-07-06 NOTE — Progress Notes (Signed)
°  Phone Number: [phone] for Surgical Coordinator Fax Number: [phone]  REQUEST FOR SURGICAL CLEARANCE      Date: 07/06/2024  Faxed to: Dr. Burlene, MD  Surgeon: Dr. Penne Skye, MD     Date of Surgery: 08/07/2024  Operation: Robotic Laparoscopic Radical Prostatectomy with Bilateral Pelvic Lymph Node Dissection   Anesthesia Type: General   Diagnosis: Prostate Cancer  Patient Requires:   Cardiac / Vascular Clearance : Yes  Reason: Would like for patient to hold ASA prior to surgery    Risk Assessment:    Low   []       Moderate   []     High   []           This patient is optimized for surgery  YES []       NO   []    I recommend further assessment/workup prior to surgery. YES []      NO  []   Appointment scheduled for: _______________________   Further recommendations: ____________________________________     Physician Signature:__________________________________   Printed Name: ________________________________________   Date: _________________

## 2024-07-31 ENCOUNTER — Other Ambulatory Visit: Payer: Self-pay

## 2024-07-31 ENCOUNTER — Encounter
Admission: RE | Admit: 2024-07-31 | Discharge: 2024-07-31 | Disposition: A | Discharge status: Home / Self Care | Source: Ambulatory Visit | Attending: Urology | Admitting: Urology

## 2024-07-31 HISTORY — DX: Dyspnea, unspecified: R06.00

## 2024-07-31 HISTORY — DX: Cardiac arrhythmia, unspecified: I49.9

## 2024-07-31 HISTORY — DX: Prediabetes: R73.03

## 2024-07-31 HISTORY — DX: Malignant (primary) neoplasm, unspecified: C80.1

## 2024-07-31 NOTE — Patient Instructions (Addendum)
 Your procedure is scheduled on: 08/07/24 - Tuesday Report to the Registration Desk on the 1st floor of the Medical Mall. To find out your arrival time, please call [phone] between 1PM - 3PM on: 08/06/24 - Monday If your arrival time is 6:00 am, do not arrive before that time as the Medical Mall entrance doors do not open until 6:00 am.  REMEMBER: Instructions that are not followed completely may result in serious medical risk, up to and including death; or upon the discretion of your surgeon and anesthesiologist your surgery may need to be rescheduled.  Do not eat food or drink any liquids after midnight the night before surgery.  No gum chewing or hard candies.  One week prior to surgery: Stop Anti-inflammatories (NSAIDS) such as Advil, Aleve, Ibuprofen, Motrin, Naproxen, Naprosyn and Aspirin based products such as Excedrin, Goody's Powder, BC Powder. You may take Tylenol if needed for pain up until the day of surgery.  Stop ANY OVER THE COUNTER supplements until after surgery.  ON THE DAY OF SURGERY ONLY TAKE THESE MEDICATIONS WITH SIPS OF WATER:  amLODipine (NORVASC)  tamsulosin  (FLOMAX )    No Alcohol for 24 hours before or after surgery.  No Smoking including e-cigarettes for 24 hours before surgery.  No chewable tobacco products for at least 6 hours before surgery.  No nicotine patches on the day of surgery.  Do not use any recreational drugs for at least a week (preferably 2 weeks) before your surgery.  Please be advised that the combination of cocaine and anesthesia may have negative outcomes, up to and including death. If you test positive for cocaine, your surgery will be cancelled.  On the morning of surgery brush your teeth with toothpaste and water, you may rinse your mouth with mouthwash if you wish. Do not swallow any toothpaste or mouthwash.  Use CHG Soap or wipes as directed on instruction sheet.  Do not wear jewelry, make-up, hairpins, clips or nail  polish.  For welded (permanent) jewelry: bracelets, anklets, waist bands, etc.  Please have this removed prior to surgery.  If it is not removed, there is a chance that hospital personnel will need to cut it off on the day of surgery.  Do not wear lotions, powders, or perfumes.   Do not shave body hair from the neck down 48 hours before surgery.  Contact lenses, hearing aids and dentures may not be worn into surgery.  Do not bring valuables to the hospital. [facility] is not responsible for any missing/lost belongings or valuables.   Notify your doctor if there is any change in your medical condition (cold, fever, infection).  Wear comfortable clothing (specific to your surgery type) to the hospital.  After surgery, you can help prevent lung complications by doing breathing exercises.  Take deep breaths and cough every 1-2 hours. Your doctor may order a device called an Incentive Spirometer to help you take deep breaths.  If you are being admitted to the hospital overnight, leave your suitcase in the car. After surgery it may be brought to your room.  In case of increased patient census, it may be necessary for you, the patient, to continue your postoperative care in the Same Day Surgery department.  If you are being discharged the day of surgery, you will not be allowed to drive home. You will need a responsible individual to drive you home and stay with you for 24 hours after surgery.   If you are taking public transportation, you will  need to have a responsible individual with you.  Please call the Pre-admissions Testing Dept. at [phone] if you have any questions about these instructions.  Surgery Visitation Policy:  Patients having surgery or a procedure may have two visitors.  Children under the age of 44 must have an adult with them who is not the patient.  Inpatient Visitation:    Visiting hours are 7 a.m. to 8 p.m. Up to four visitors are allowed at one time in a  patient room. The visitors may rotate out with other people during the day.  One visitor age 58 or older may stay with the patient overnight and must be in the room by 8 p.m.   Merchandiser, Retail to address health-related social needs:  [email or web address]                                                                                                            Preparing for Surgery with CHLORHEXIDINE GLUCONATE (CHG) Soap  Chlorhexidine Gluconate (CHG) Soap  o An antiseptic cleaner that kills germs and bonds with the skin to continue killing germs even after washing  o Used for showering the night before surgery and morning of surgery  Before surgery, you can play an important role by reducing the number of germs on your skin.  CHG (Chlorhexidine gluconate) soap is an antiseptic cleanser which kills germs and bonds with the skin to continue killing germs even after washing.  Please do not use if you have an allergy to CHG or antibacterial soaps. If your skin becomes reddened/irritated stop using the CHG.  1. Shower the NIGHT BEFORE SURGERY with CHG soap.  2. If you choose to wash your hair, wash your hair first as usual with your normal shampoo.  3. After shampooing, rinse your hair and body thoroughly to remove the shampoo.  4. Use CHG as you would any other liquid soap. You can apply CHG directly to the skin and wash gently with a clean washcloth.  5. Apply the CHG soap to your body only from the neck down. Do not use on open wounds or open sores. Avoid contact with your eyes, ears, mouth, and genitals (private parts). Wash face and genitals (private parts) with your normal soap.  6. Wash thoroughly, paying special attention to the area where your surgery will be performed.  7. Thoroughly rinse your body with warm water.  8. Do not shower/wash with your normal soap after using and rinsing off the CHG soap.  9. Do not use lotions, oils, etc., after  showering with CHG.  10. Pat yourself dry with a clean towel.  11. Wear clean pajamas to bed the night before surgery.  12. Place clean sheets on your bed the night of your shower and do not sleep with pets.  13. Do not apply any deodorants/lotions/powders.  14. Please wear clean clothes to the hospital.  15. Remember to brush your teeth with your regular toothpaste.

## 2024-08-01 NOTE — Progress Notes (Signed)
 Last Cardiology visit and prior clearance for prostate biopsy:  Bruce Evalene PARAS, MD  Physician Cardiology   Progress Notes     Addendum   Encounter Date: 05/08/2024   Expand All Collapse All  Cardiology Office Note   Date:  05/08/2024    ID:  Bruce Brady, DOB 1951/03/01, MRN [id number]   PCP:  Buren Rock HERO, MD             Chief Complaint  Patient presents with   New Patient (Initial Visit)      Patient was seen by [facility] for a cardiac clearance prior to (BIOPSY) prostate surgery. The patient is transferring care to [facility]. The patient will have the prostate surgery at [facility] in [place].  Patient denies chest pain or shortness of breath.       HPI:  Bruce Brady a 74 y.o. 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 Rock Buren for aorta dilation, hypertension   Seen by [facility] cardiology last April 30, 2024 Scheduled for biopsy for prostate cancer Elevated PSA   Blood pressure well-controlled on amlodipine 10  losartan 100 per [facility] Previously on carvedilol and spironolactone   On follow-up phone calls after his visit he reported he was not taking carvedilol, spironolactone, sildenafil    Prior imaging reviewed Ascending aorta 4.3 cm on MRI chest August 2023 Unchanged from 4/17, aorta 4.3 cm   Echo October 2022 normal left ventricular function, mild dilation of aorta estimated 3.8 cm   Sedentary at baseline, or few hobbies, watches TV Lives with his wife No regular exercise program   Denies chest pain or shortness of breath on exertion No leg swelling, no PND orthopnea   EKG personally reviewed by myself on todays visit EKG Interpretation Date/Time:                  Tuesday May 08 2024 15:06:37 EDT Ventricular Rate:         81 PR Interval:                 168 QRS Duration:             102 QT Interval:                  396 QTC Calculation:460 R Axis:                         45   Text Interpretation:      Normal sinus rhythm Low voltage QRS Nonspecific T wave abnormality Prolonged QT When compared with ECG of 24-Nov-1999 09:05, Premature atrial complexes are no longer Present Vent. rate has increased BY  32 BPM Nonspecific T wave abnormality now evident in Inferior leads Nonspecific T wave abnormality, worse in Lateral leads QT has lengthened Confirmed by Gollan, Timothy [phone]) on 05/08/2024 3:16:14 PM      PMH:   has a past medical history of Dilated aortic root, Gout, H/O blood clots, and Hypertension.   PSH:         Past Surgical History:  Procedure Laterality Date   CATARACT EXTRACTION W/PHACO Left 10/31/2023    Procedure: CATARACT EXTRACTION PHACO AND INTRAOCULAR LENS PLACEMENT (IOC) LEFT 7.79 00:54.7;  Surgeon: Myrna Adine Anes, MD;  Location: [facility];  Service: Ophthalmology;  Laterality: Left;   CATARACT EXTRACTION W/PHACO Right 11/14/2023  Procedure: CATARACT EXTRACTION PHACO AND INTRAOCULAR LENS PLACEMENT (IOC) RIGHT 9.53, 01:07.0;  Surgeon: Myrna Adine Anes, MD;  Location: [facility];  Service: Ophthalmology;  Laterality: Right;                Current Outpatient Medications  Medication Sig Dispense Refill   amLODipine (NORVASC) 10 MG tablet Take 10 mg by mouth daily.       atorvastatin (LIPITOR) 10 MG tablet Take 10 mg by mouth daily.       losartan (COZAAR) 100 MG tablet Take 100 mg by mouth daily.       tamsulosin  (FLOMAX ) 0.4 MG CAPS capsule Take 1 capsule (0.4 mg total) by mouth daily. 30 capsule 11   aspirin EC 81 MG tablet Take 1 tablet by mouth daily. (Patient not taking: Reported on 05/08/2024)        No current facility-administered medications for this visit.        Allergies:   Hydrochlorothiazide    Social History:  The patient  reports that he has never smoked. He has never used smokeless tobacco. He reports current alcohol use. He reports that he  does not use drugs.    Family History:   family history includes Heart attack in his mother; Heart attack (age of onset: 15) in his brother.      Review of Systems: Review of Systems  Constitutional: Negative.   HENT: Negative.    Respiratory: Negative.    Cardiovascular: Negative.   Gastrointestinal: Negative.   Musculoskeletal: Negative.   Neurological: Negative.   Psychiatric/Behavioral: Negative.    All other systems reviewed and are negative.  PHYSICAL EXAM: VS:  BP 120/72 (BP Location: Right Arm, Patient Position: Sitting, Cuff Size: Large)   Pulse 81   Ht 5' 9 (1.753 m)   Wt 207 lb (93.9 kg)   SpO2 98%   BMI 30.57 kg/m  , BMI Body mass index is 30.57 kg/m. GEN: Well nourished, well developed, in no acute distress HEENT: normal Neck: no JVD, carotid bruits, or masses Cardiac: RRR; no murmurs, rubs, or gallops,no edema  Respiratory:  clear to auscultation bilaterally, normal work of breathing GI: soft, nontender, nondistended, + BS MS: no deformity or atrophy Skin: warm and dry, no rash Neuro:  Strength and sensation are intact Psych: euthymic mood, full affect   Recent Labs: No results found for requested labs within last 365 days.      Lipid Panel Recent Labs  No results found for: CHOL, HDL, LDLCALC, TRIG          Wt Readings from Last 3 Encounters:  05/08/24 207 lb (93.9 kg)  04/03/24 206 lb 9.6 oz (93.7 kg)  03/08/24 162 lb 1.6 oz (73.5 kg)      ASSESSMENT AND PLAN:   Problem List Items Addressed This Visit              Cardiology Problems    Ascending aorta dilation    Relevant Orders    EKG 12-Lead (Completed)    Hyperlipidemia    LVH (left ventricular hypertrophy)    Relevant Orders    EKG 12-Lead (Completed)        Other    Prediabetes - Primary    Preop cardiovascular evaluation Acceptable risk for prostate biopsy He can stop aspirin 5 days prior to procedure, he reports that he has already stopped aspirin   Ascending  aorta dilation 4.3 cm in April 2017 and August 2023, unchanged Consider repeat imaging next year  with CT scan Did not seem to show up well on echocardiogram 2022 was underestimated 3.8 cm at [facility] Stressed importance of aggressive blood pressure control   Essential hypertension Blood pressure is well controlled on today's visit. No changes made to the medications. On follow-up phone calls he reports not taking carvedilol and spironolactone Blood pressure 120/70 on his office visit   Hyperlipidemia No recent lipid panel available Managed by primary care On Lipitor 10 daily       Signed, Tim Gollan, M.D., Ph.D. [facility] [place], [place] [phone]    Electronically signed by Bruce Evalene PARAS, MD at 05/08/2024  3:31 PM Electronically signed by Bruce Evalene PARAS, MD at 05/18/2024  8:18 AM     Office Visit on 05/08/2024       Revision History       Detailed Report      Note shared with patient Additional Documentation  Vitals: BP 120/72 (BP Location: Right Arm, Patient Position: Sitting, Cuff Size: Large)   Pulse 81   Ht 5' 9 (1.753 m)   Wt 93.9 kg   SpO2 98%   BMI 30.57 kg/m   BSA 2.14 m      More Vitals  Flowsheets: Peripheral Arterial Disease Screen,   MEWS Score,   Anthropometrics,   NEWS,   Vital Signs,   Data  Encounter Info: Billing Info,   History,   Allergies,   Detailed Report

## 2024-08-02 ENCOUNTER — Encounter
Admission: RE | Admit: 2024-08-02 | Discharge: 2024-08-02 | Disposition: A | Discharge status: Home / Self Care | Source: Ambulatory Visit | Attending: Urology | Admitting: Urology

## 2024-08-02 DIAGNOSIS — Z01818 Encounter for other preprocedural examination: Secondary | ICD-10-CM | POA: Diagnosis present

## 2024-08-02 DIAGNOSIS — Z01812 Encounter for preprocedural laboratory examination: Secondary | ICD-10-CM | POA: Diagnosis not present

## 2024-08-02 DIAGNOSIS — C61 Malignant neoplasm of prostate: Secondary | ICD-10-CM | POA: Diagnosis not present

## 2024-08-02 LAB — CBC
HCT: 40.1 % (ref 39.0–52.0)
Hemoglobin: 13.3 g/dL (ref 13.0–17.0)
MCH: 28.2 pg (ref 26.0–34.0)
MCHC: 33.2 g/dL (ref 30.0–36.0)
MCV: 85.1 fL (ref 80.0–100.0)
Platelets: 324 K/uL (ref 150–400)
RBC: 4.71 MIL/uL (ref 4.22–5.81)
RDW: 14.6 % (ref 11.5–15.5)
WBC: 6.9 K/uL (ref 4.0–10.5)
nRBC: 0 % (ref 0.0–0.2)

## 2024-08-02 LAB — URINALYSIS, COMPLETE (UACMP) WITH MICROSCOPIC
Bacteria, UA: NONE SEEN
Bilirubin Urine: NEGATIVE
Glucose, UA: NEGATIVE mg/dL
Hgb urine dipstick: NEGATIVE
Ketones, ur: NEGATIVE mg/dL
Leukocytes,Ua: NEGATIVE
Nitrite: NEGATIVE
Protein, ur: NEGATIVE mg/dL
Specific Gravity, Urine: 1.016 (ref 1.005–1.030)
pH: 5 (ref 5.0–8.0)

## 2024-08-02 LAB — BASIC METABOLIC PANEL WITH GFR
Anion gap: 11 (ref 5–15)
BUN: 16 mg/dL (ref 8–23)
CO2: 22 mmol/L (ref 22–32)
Calcium: 10.1 mg/dL (ref 8.9–10.3)
Chloride: 106 mmol/L (ref 98–111)
Creatinine, Ser: 1.34 mg/dL — ABNORMAL HIGH (ref 0.61–1.24)
GFR, Estimated: 56 mL/min — ABNORMAL LOW
Glucose, Bld: 103 mg/dL — ABNORMAL HIGH (ref 70–99)
Potassium: 3.9 mmol/L (ref 3.5–5.1)
Sodium: 140 mmol/L (ref 135–145)

## 2024-08-02 LAB — TYPE AND SCREEN
ABO/RH(D): O POS
Antibody Screen: NEGATIVE

## 2024-08-02 LAB — PROTIME-INR
INR: 1.1 (ref 0.8–1.2)
Prothrombin Time: 14.5 s (ref 11.4–15.2)

## 2024-08-02 LAB — APTT: aPTT: 38 s — ABNORMAL HIGH (ref 24–36)

## 2024-08-03 LAB — URINE CULTURE: Culture: <10000 — AB

## 2024-08-03 NOTE — Progress Notes (Signed)
 Blood products consent  Spoke to patient this morning He had mentioned to prescreening nurse that he did not except blood products I called him to discuss further- upcoming surgery 08/07/24  Patient does not want any routine transfusion of blood products (borderline Hgb ~7, stable /symptomatic etc.)  -For personal reasons, he is not a practicing Jehovah's Witness  I explained the very low rate of transfusion with his upcoming surgery, although the very important distinction of blood products refusal with major abdominal pelvic operations.   Ultimately, he will accept a transfusion during the perioperative period if an emergency and for life-saving measures only

## 2024-08-07 ENCOUNTER — Ambulatory Visit: Admitting: Certified Registered"

## 2024-08-07 ENCOUNTER — Observation Stay
Admission: RE | Admit: 2024-08-07 | Discharge: 2024-08-08 | Disposition: A | Discharge status: Home / Self Care | Attending: Urology | Admitting: Urology

## 2024-08-07 ENCOUNTER — Encounter: Admission: RE | Payer: Self-pay | Source: Home / Self Care

## 2024-08-07 ENCOUNTER — Encounter: Payer: Self-pay | Admitting: Urology

## 2024-08-07 ENCOUNTER — Other Ambulatory Visit: Payer: Self-pay

## 2024-08-07 DIAGNOSIS — I10 Essential (primary) hypertension: Secondary | ICD-10-CM | POA: Diagnosis not present

## 2024-08-07 DIAGNOSIS — C61 Malignant neoplasm of prostate: Principal | ICD-10-CM | POA: Insufficient documentation

## 2024-08-07 DIAGNOSIS — Z79899 Other long term (current) drug therapy: Secondary | ICD-10-CM | POA: Insufficient documentation

## 2024-08-07 HISTORY — PX: ROBOT ASSISTED LAPAROSCOPIC RADICAL PROSTATECTOMY: SHX5141

## 2024-08-07 LAB — ABO/RH: ABO/RH(D): O POS

## 2024-08-07 MED ORDER — OXYBUTYNIN CHLORIDE 5 MG PO TABS: 5.0000 mg | ORAL_TABLET | Freq: Three times a day (TID) | ORAL | Status: DC | PRN

## 2024-08-07 MED ORDER — FENTANYL CITRATE (PF) 100 MCG/2ML IJ SOLN
INTRAMUSCULAR | Status: AC
  Filled 2024-08-07: qty 2

## 2024-08-07 MED ORDER — ACETAMINOPHEN 500 MG PO TABS: 1000.0000 mg | ORAL_TABLET | Freq: Four times a day (QID) | ORAL | Status: DC

## 2024-08-07 MED ORDER — HYDROMORPHONE HCL 1 MG/ML IJ SOLN
INTRAMUSCULAR | Status: AC
  Filled 2024-08-07: qty 1

## 2024-08-07 MED ORDER — PHENYLEPHRINE HCL-NACL 20-0.9 MG/250ML-% IV SOLN
INTRAVENOUS | Status: DC | PRN
  Administered 2024-08-07: 50 ug/min via INTRAVENOUS

## 2024-08-07 MED ORDER — DEXAMETHASONE SOD PHOSPHATE PF 10 MG/ML IJ SOLN
INTRAMUSCULAR | Status: AC
  Filled 2024-08-07: qty 1

## 2024-08-07 MED ORDER — ROCURONIUM BROMIDE 10 MG/ML (PF) SYRINGE
PREFILLED_SYRINGE | INTRAVENOUS | Status: AC
  Filled 2024-08-07: qty 10

## 2024-08-07 MED ORDER — PHENYLEPHRINE 80 MCG/ML (10ML) SYRINGE FOR IV PUSH (FOR BLOOD PRESSURE SUPPORT)
PREFILLED_SYRINGE | INTRAVENOUS | Status: AC
  Filled 2024-08-07: qty 10

## 2024-08-07 MED ORDER — ACETAMINOPHEN 10 MG/ML IV SOLN
INTRAVENOUS | Status: AC
  Filled 2024-08-07: qty 100

## 2024-08-07 MED ORDER — ALBUMIN HUMAN 5 % IV SOLN: INTRAVENOUS | Status: DC | PRN

## 2024-08-07 MED ORDER — SODIUM CHLORIDE 0.9 % IV BOLUS
1000.0000 mL | Freq: Once | INTRAVENOUS | Status: AC
  Administered 2024-08-07: 1000 mL via INTRAVENOUS

## 2024-08-07 MED ORDER — KETAMINE HCL 50 MG/5ML IJ SOSY
PREFILLED_SYRINGE | INTRAMUSCULAR | Status: AC
  Filled 2024-08-07: qty 5

## 2024-08-07 MED ORDER — CEFAZOLIN SODIUM-DEXTROSE 2-4 GM/100ML-% IV SOLN
INTRAVENOUS | Status: AC
  Filled 2024-08-07: qty 100

## 2024-08-07 MED ORDER — LIDOCAINE HCL (CARDIAC) PF 100 MG/5ML IV SOSY
PREFILLED_SYRINGE | INTRAVENOUS | Status: DC | PRN
  Administered 2024-08-07: 100 mg via INTRAVENOUS

## 2024-08-07 MED ORDER — CEFAZOLIN SODIUM-DEXTROSE 2-4 GM/100ML-% IV SOLN
2.0000 g | INTRAVENOUS | Status: AC
  Administered 2024-08-07: 2 g via INTRAVENOUS

## 2024-08-07 MED ORDER — HEMOSTATIC AGENTS (NO CHARGE) OPTIME
TOPICAL | Status: DC | PRN
  Administered 2024-08-07 (×3): 1 via TOPICAL

## 2024-08-07 MED ORDER — ONDANSETRON HCL 4 MG/2ML IJ SOLN
INTRAMUSCULAR | Status: AC
  Filled 2024-08-07: qty 2

## 2024-08-07 MED ORDER — CHLORHEXIDINE GLUCONATE 0.12 % MT SOLN
15.0000 mL | Freq: Once | OROMUCOSAL | Status: AC
  Administered 2024-08-07: 15 mL via OROMUCOSAL

## 2024-08-07 MED ORDER — EPHEDRINE 5 MG/ML INJ
INTRAVENOUS | Status: AC
  Filled 2024-08-07: qty 5

## 2024-08-07 MED ORDER — ROCURONIUM BROMIDE 100 MG/10ML IV SOLN
INTRAVENOUS | Status: DC | PRN
  Administered 2024-08-07: 30 mg via INTRAVENOUS
  Administered 2024-08-07: 50 mg via INTRAVENOUS
  Administered 2024-08-07: 40 mg via INTRAVENOUS

## 2024-08-07 MED ORDER — DEXTROSE-SODIUM CHLORIDE 5-0.45 % IV SOLN: INTRAVENOUS | Status: DC

## 2024-08-07 MED ORDER — MIDAZOLAM HCL 2 MG/2ML IJ SOLN
INTRAMUSCULAR | Status: AC
  Filled 2024-08-07: qty 2

## 2024-08-07 MED ORDER — FENTANYL CITRATE (PF) 100 MCG/2ML IJ SOLN
INTRAMUSCULAR | Status: DC | PRN
  Administered 2024-08-07: 100 ug via INTRAVENOUS

## 2024-08-07 MED ORDER — CHLORHEXIDINE GLUCONATE 4 % EX SOLN
60.0000 mL | Freq: Once | CUTANEOUS | Status: AC
  Administered 2024-08-07: 4 via TOPICAL

## 2024-08-07 MED ORDER — OXYCODONE HCL 5 MG PO TABS
ORAL_TABLET | ORAL | Status: AC
  Filled 2024-08-07: qty 1

## 2024-08-07 MED ORDER — "VISTASEAL 4 ML SINGLE DOSE KIT "
PACK | CUTANEOUS | Status: AC
  Filled 2024-08-07: qty 4

## 2024-08-07 MED ORDER — OXYCODONE HCL 5 MG/5ML PO SOLN: 5.0000 mg | Freq: Once | ORAL | Status: AC | PRN

## 2024-08-07 MED ORDER — ORAL CARE MOUTH RINSE: 15.0000 mL | Freq: Once | OROMUCOSAL | Status: AC

## 2024-08-07 MED ORDER — BUPIVACAINE LIPOSOME 1.3 % IJ SUSP
INTRAMUSCULAR | Status: DC | PRN
  Administered 2024-08-07: 50 mL via INTRAMUSCULAR

## 2024-08-07 MED ORDER — ALBUMIN HUMAN 5 % IV SOLN
INTRAVENOUS | Status: AC
  Filled 2024-08-07: qty 750

## 2024-08-07 MED ORDER — SODIUM CHLORIDE 0.9 % IV SOLN: INTRAVENOUS | Status: DC | PRN

## 2024-08-07 MED ORDER — LACTATED RINGERS IV SOLN: INTRAVENOUS | Status: DC

## 2024-08-07 MED ORDER — HYDROMORPHONE HCL 1 MG/ML IJ SOLN
0.2500 mg | INTRAMUSCULAR | Status: DC | PRN
  Administered 2024-08-07: 0.25 mg via INTRAVENOUS

## 2024-08-07 MED ORDER — "VISTASEAL 4 ML SINGLE DOSE KIT "
PACK | CUTANEOUS | Status: DC | PRN
  Administered 2024-08-07: 4 mL via TOPICAL

## 2024-08-07 MED ORDER — PROPOFOL 10 MG/ML IV BOLUS
INTRAVENOUS | Status: DC | PRN
  Administered 2024-08-07: 50 mg via INTRAVENOUS
  Administered 2024-08-07: 150 mg via INTRAVENOUS

## 2024-08-07 MED ORDER — LACTATED RINGERS IV SOLN: INTRAVENOUS | Status: DC | PRN

## 2024-08-07 MED ORDER — ACETAMINOPHEN 500 MG PO TABS
1000.0000 mg | ORAL_TABLET | Freq: Four times a day (QID) | ORAL | Status: DC
  Administered 2024-08-07 – 2024-08-08 (×3): 1000 mg via ORAL
  Filled 2024-08-07 (×3): qty 2

## 2024-08-07 MED ORDER — OXYCODONE HCL 5 MG PO TABS: 5.0000 mg | ORAL_TABLET | ORAL | Status: DC | PRN

## 2024-08-07 MED ORDER — STERILE WATER FOR IRRIGATION IR SOLN
Status: DC | PRN
  Administered 2024-08-07: 1000 mL
  Administered 2024-08-07: 3000 mL

## 2024-08-07 MED ORDER — PHENYLEPHRINE 80 MCG/ML (10ML) SYRINGE FOR IV PUSH (FOR BLOOD PRESSURE SUPPORT)
PREFILLED_SYRINGE | INTRAVENOUS | Status: DC | PRN
  Administered 2024-08-07: 80 ug via INTRAVENOUS
  Administered 2024-08-07 (×3): 160 ug via INTRAVENOUS
  Administered 2024-08-07: 80 ug via INTRAVENOUS
  Administered 2024-08-07: 160 ug via INTRAVENOUS
  Administered 2024-08-07: 200 ug via INTRAVENOUS

## 2024-08-07 MED ORDER — ONDANSETRON HCL 4 MG/2ML IJ SOLN
4.0000 mg | INTRAMUSCULAR | Status: DC | PRN
  Administered 2024-08-08: 4 mg via INTRAVENOUS
  Filled 2024-08-07: qty 2

## 2024-08-07 MED ORDER — HYDROMORPHONE HCL 1 MG/ML IJ SOLN
INTRAMUSCULAR | Status: DC | PRN
  Administered 2024-08-07: 1 mg via INTRAVENOUS

## 2024-08-07 MED ORDER — ONDANSETRON HCL 4 MG/2ML IJ SOLN
INTRAMUSCULAR | Status: DC | PRN
  Administered 2024-08-07 (×2): 4 mg via INTRAVENOUS

## 2024-08-07 MED ORDER — HEPARIN SODIUM (PORCINE) 5000 UNIT/ML IJ SOLN
INTRAMUSCULAR | Status: DC | PRN
  Administered 2024-08-07: 5000 [IU] via SUBCUTANEOUS

## 2024-08-07 MED ORDER — BUPIVACAINE HCL (PF) 0.5 % IJ SOLN
INTRAMUSCULAR | Status: AC
  Filled 2024-08-07: qty 30

## 2024-08-07 MED ORDER — SUGAMMADEX SODIUM 200 MG/2ML IV SOLN
INTRAVENOUS | Status: DC | PRN
  Administered 2024-08-07: 300 mg via INTRAVENOUS

## 2024-08-07 MED ORDER — SENNOSIDES-DOCUSATE SODIUM 8.6-50 MG PO TABS
2.0000 | ORAL_TABLET | Freq: Every day | ORAL | Status: DC
  Administered 2024-08-07: 2 via ORAL
  Filled 2024-08-07: qty 2

## 2024-08-07 MED ORDER — OXYCODONE HCL 5 MG PO TABS
5.0000 mg | ORAL_TABLET | Freq: Once | ORAL | Status: AC | PRN
  Administered 2024-08-07: 5 mg via ORAL

## 2024-08-07 MED ORDER — CHLORHEXIDINE GLUCONATE 4 % EX SOLN: 60.0000 mL | Freq: Once | CUTANEOUS | Status: AC

## 2024-08-07 MED ORDER — ACETAMINOPHEN 10 MG/ML IV SOLN
INTRAVENOUS | Status: DC | PRN
  Administered 2024-08-07: 1000 mg via INTRAVENOUS

## 2024-08-07 MED ORDER — MIDAZOLAM HCL (PF) 2 MG/2ML IJ SOLN
INTRAMUSCULAR | Status: DC | PRN
  Administered 2024-08-07: 2 mg via INTRAVENOUS

## 2024-08-07 MED ORDER — SUCCINYLCHOLINE CHLORIDE 200 MG/10ML IV SOSY
PREFILLED_SYRINGE | INTRAVENOUS | Status: AC
  Filled 2024-08-07: qty 10

## 2024-08-07 MED ORDER — LIDOCAINE HCL (PF) 2 % IJ SOLN
INTRAMUSCULAR | Status: AC
  Filled 2024-08-07: qty 5

## 2024-08-07 MED ORDER — CHLORHEXIDINE GLUCONATE 0.12 % MT SOLN
OROMUCOSAL | Status: AC
  Filled 2024-08-07: qty 15

## 2024-08-07 MED ORDER — HEPARIN SODIUM (PORCINE) 5000 UNIT/ML IJ SOLN
INTRAMUSCULAR | Status: AC
  Filled 2024-08-07: qty 1

## 2024-08-07 MED ORDER — OXYCODONE HCL 5 MG PO TABS
5.0000 mg | ORAL_TABLET | ORAL | Status: DC | PRN
  Administered 2024-08-07 – 2024-08-08 (×2): 5 mg via ORAL
  Filled 2024-08-07 (×2): qty 1

## 2024-08-07 MED ORDER — ONDANSETRON HCL 4 MG/2ML IJ SOLN: 4.0000 mg | Freq: Once | INTRAMUSCULAR | Status: DC | PRN

## 2024-08-07 MED ORDER — METOPROLOL TARTRATE 5 MG/5ML IV SOLN
INTRAVENOUS | Status: DC | PRN
  Administered 2024-08-07: 2 mg via INTRAVENOUS

## 2024-08-07 MED ORDER — VASOPRESSIN 20 UNIT/ML IV SOLN
INTRAVENOUS | Status: DC | PRN
  Administered 2024-08-07 (×2): 2 [IU] via INTRAVENOUS

## 2024-08-07 MED ORDER — BUPIVACAINE LIPOSOME 1.3 % IJ SUSP
INTRAMUSCULAR | Status: AC
  Filled 2024-08-07: qty 20

## 2024-08-07 MED ORDER — DEXMEDETOMIDINE HCL IN NACL 200 MCG/50ML IV SOLN
INTRAVENOUS | Status: DC | PRN
  Administered 2024-08-07 (×2): 8 ug via INTRAVENOUS

## 2024-08-07 MED ORDER — KETAMINE HCL 10 MG/ML IJ SOLN
INTRAMUSCULAR | Status: DC | PRN
  Administered 2024-08-07: 30 mg via INTRAVENOUS

## 2024-08-07 MED ORDER — DEXAMETHASONE SOD PHOSPHATE PF 10 MG/ML IJ SOLN
INTRAMUSCULAR | Status: DC | PRN
  Administered 2024-08-07: 10 mg via INTRAVENOUS

## 2024-08-07 NOTE — Op Note (Signed)
 Date of procedure: 08/07/2024  Preoperative diagnosis:  Prostate cancer   Postoperative diagnosis:  Prostate Cancer   Procedure: Robot assisted laparoscopic radical prostatectomy Bilateral pelvic lymph node dissection  Surgeon: Penne Skye, MD  Anesthesia: General  Complications: None  Intraoperative findings:  Successful RALP + BPLND, bilateral nerve spare Watertight anastomosis at 150cc  EBL: 100c  Specimens:  Periprostatic lymph nodes Left pelvic lymph nodes Prostate, seminal vesicals, and proximal vas deferens + Right pelvic lymph nodes  Drains:  35F 2-way indwelling Foley catheter with 20cc in balloon  Indication: Bruce Brady is a 74 y.o. patient with: Unfavorable intermediate-risk prostate Ca    - PSA 6.2 (Aug 2025), %free 8.2   - MRI (03/27/24) - PIRADS 4 at Left lateral mid PZ,  23g gland   - Fusion bx (05/23/24) - GS 4+3=7 in 1/13 cores (~65% involvement), GS 3+4=7 i   After reviewing the management options for treatment, they elected to proceed with the above surgical procedure(s). We have discussed the potential benefits and risks of the procedure, side effects of the proposed treatment, the likelihood of the patient achieving the goals of the procedure, and any potential problems that might occur during the procedure or recuperation. Informed consent has been obtained.  Description of procedure:  Following induction, the patient was positioned in the supine position, taking care to pad all pressure points. Pre-operative 5000U subQ heparin  was administered along with 2g Ancef . A 3 pt identifying pre-incision surgical timeout was performed. A sterile Foley catheter was placed on the field. Next, a 1 cm supraumbilical incision was made, followed by Veress needle entry into the abdomen. Pneumoperitoneum of 15 mm Hg was achieved without issue. Next, we introduced an 8 mm robotic port. The robotic camera was inserted the abdomen was found to be free of injury.  There were some mild left lower colonic side wall adhesions. The remainder of the robotic ports were placed under direct visualization along a straight line at the umbilical level. A 12 mm Left lateral assistant port and a 5 mm LUQ port were placed. The patient was placed in steep Trendelenberg and the robot was docked. We began by taking down the LLQ sigmoid attachments sharply. Next, we started our dissection by incising the posterior peritoneum. Bilateral vas deferens were identified and divided prior to isolating and dissecting each seminal vesicle. Next, we developed a plane along Denovilliers fascia towards the prostatic apex. We then moved anteriorly where the bladder was dropped in standard fashion by dividing bilateral medial ligaments. We continued our dissection through each paravesical fat plane until the endopelvics were encountered and opened sharply. The puboprostatics were released exposing the underlying dorsal venous complex. Next, we incised our bladder neck. There was no median prostate lobe. The posterior plane was opened and we delivered our bilateral seminal vesicles and proximal vas deferens. A bilateral nerve spare was peformed by sharply dissecting the lateral prostatic fascia and releasing this inferolaterally. Bilateral prostate pedicles were taken with Hemolok clips. We completed our posterior plane, taking care to preserve our neurovascular bundles inferolaterally. Next, we came across the DVC with a pneumoperitoneum of 20mm Hg. The apex of the prostate was fully delineated and the urethral contours were exposed. The urethra was divided sharply. We oversewed the DVC with a running 3-0 Stratafix. Hemostasis was excellent and the pneumoperitoneum was returned to 15. At this point the prostate specimen was bagged and the pelvis irrigated. Next, we performed a bilateral pelvic lymph node dissection. Care was taken bilaterally  to identify and preserve the obturator nerves which were clearly  visualized. Lymph node packets were sent as separate final specimen. Next, we began our anastomosis. This was completed using a double armed 3-0 Stratafix. Prior to final closure we easily passed our final 56F 2-way catheter under direct visualization. The anastomosis was tied and the bladder irrigated with 150cc of sterile water  without leak. Surgiflo was placed along each obturator fossa. Hemostasis was excellent. The robot instruments were removed and the robot undocked. The prostate specimen was extracted through the midline incision by gently incising the fascia. The fascia was closed with two opposing 0-Vicryl stitches tied in the middle. The 12 mm assistant port was closed with a 0-vicryl on UR6 in a figure of 8 type fashion. Port incisions were closed with 4-0 monocryl subcuticular stitches, followed by Dermabond. Catheter was draining clear yellow urine. The patient was awoken from anesthesia sent to the PACU in excellent condition.  Disposition: Stable to PACU  Plan: - Admit to Medsurg, overnight observation - Expect DC home tomorrow  - 10 day follow up for catheter removal + TOV  Penne Skye, MD

## 2024-08-07 NOTE — Anesthesia Procedure Notes (Signed)
 Procedure Name: Intubation Date/Time: 08/07/2024 12:35 PM  Performed by: Ledora Duncan, CRNAPre-anesthesia Checklist: Patient identified, Emergency Drugs available, Suction available and Patient being monitored Patient Re-evaluated:Patient Re-evaluated prior to induction Oxygen Delivery Method: Circle system utilized Preoxygenation: Pre-oxygenation with 100% oxygen Induction Type: IV induction Ventilation: Mask ventilation without difficulty Laryngoscope Size: McGrath and 4 Grade View: Grade I Tube type: Oral Tube size: 7.5 mm Number of attempts: 1 Airway Equipment and Method: Stylet and Oral airway Placement Confirmation: ETT inserted through vocal cords under direct vision, positive ETCO2 and breath sounds checked- equal and bilateral Secured at: 22 cm Tube secured with: Tape Dental Injury: Teeth and Oropharynx as per pre-operative assessment  Comments: Atraumatic intubation

## 2024-08-07 NOTE — Transfer of Care (Signed)
 C v Immediate Anesthesia Transfer of Care Note  Patient: Bruce Brady  Procedure(s) Performed: PROSTATECTOMY, RADICAL, ROBOT-ASSISTED, LAPAROSCOPIC (Prostate) LYMPHADENECTOMY, PELVIS, ROBOT-ASSISTED (Pelvis)  Patient Location: PACU  Anesthesia Type:General  Level of Consciousness: awake, drowsy, and patient cooperative  Airway & Oxygen Therapy: Patient Spontanous Breathing and Patient connected to face mask oxygen  Post-op Assessment: Report given to RN and Post -op Vital signs reviewed and stable  Post vital signs: Reviewed and stable  Last Vitals:  Vitals Value Taken Time  BP 115/66 08/07/24 16:20  Temp    Pulse 99 08/07/24 16:26  Resp 16 08/07/24 16:26  SpO2 97 % 08/07/24 16:26  Vitals shown include unfiled device data.  Last Pain:  Vitals:   08/07/24 1115  TempSrc: Temporal  PainSc: 0-No pain         Complications: No notable events documented.

## 2024-08-07 NOTE — Anesthesia Preprocedure Evaluation (Signed)
 "                                  Anesthesia Evaluation  Patient identified by MRN, date of birth, ID band Patient awake    Reviewed: Allergy & Precautions, NPO status , Patient's Chart, lab work & pertinent test results, reviewed documented beta blocker date and time   Airway Mallampati: I       Dental  (+) Poor Dentition, Missing   Pulmonary shortness of breath   Pulmonary exam normal        Cardiovascular hypertension, Normal cardiovascular exam+ dysrhythmias   NO Chest pain But SOB with exercise  Hx of DVT  Dilated aortic root LVH   Neuro/Psych negative neurological ROS  negative psych ROS   GI/Hepatic ,GERD  ,,  Endo/Other  Pre DM  Renal/GU CRFRenal disease   Prostate Cancer    Musculoskeletal negative musculoskeletal ROS (+)    Abdominal Normal abdominal exam  (+)   Peds  Hematology negative hematology ROS (+)   Anesthesia Other Findings  Hematology Labs   (All results in the 14 days before Anesthesia End) WBC  RBC  Hematocrit  Hemoglobin  Plt  MCV  6.9 08/02/24 1034 4.71 08/02/24 1034 40.1 08/02/24 1034 13.3 08/02/24 1034 324 08/02/24 1034 85.1 08/02/24 1034  Urine Labs   (All results in the 14 days before Anesthesia End) WBC UA  0-5 08/02/24 1054  Chemistry Labs   (All results in the 14 days before Anesthesia End) Na+  K  Ca2+  Cl  BUN  CRT  Glu  140 08/02/24 1034 3.9 08/02/24 1034 10.1 08/02/24 1034 106 08/02/24 1034 16 08/02/24 1034 1.34 High  08/02/24 1034 103 High  08/02/24 1034  Coagulation Labs   (All results in the 14 days before Anesthesia End) INR  1.1 08/02/24 1034  Electrolyte Labs   (All results in the 14 days before Anesthesia End) Na  K  Cl  BUN  140 08/02/24 1034 3.9 08/02/24 1034 106 08/02/24 1034 16 08/02/24 1034    Pertinent Test Results: ECG: 10.6.25 NSR, QT by computer is 402 and QTc is 472 but QT on tracing is less than one half of the R to R interval, no significant change since last tracing  3.18.22  Imaging: echo 10.21.22 Summary 1. The left ventricle is normal in size with moderately increased wall thickness. 2. The left ventricular systolic function is normal, LVEF is visually estimated at 55%. 3. The mitral valve leaflets are mildly thickened with normal leaflet mobility. 4. There is mild mitral valve regurgitation. 5. The aortic valve is trileaflet with mildly thickened leaflets with normal excursion. 6. The left atrium is moderately dilated in size. 7. The right ventricle is normal in size, with normal systolic function. 8. The right atrium is mildly dilated in size. 9. The ascending aorta is mildly dilated.   Left Ventricle The left ventricle is normal in size with moderately increased wall thickness. The left ventricular systolic function is normal, LVEF is visually estimated at 55%. There is grade II diastolic dysfunction (elevated filling pressure).  Aorta The ascending aorta is mildly dilated.  8.12.23 MRI chest Impression  1. Mild dilatation of the tubular ascending aorta measuring up to 4.3 cm, unchanged from prior.   2. A 3.1 x 2.4 cm intrinsically T2 hyperintense, enhancing lesion on the medial limb of the right adrenal gland is incompletely characterized on this study.  It is however present on the comparison MRI from 2017 and appears similar in size. 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 Dedicated abdominal imaging can be obtained if clinically indicated.       Reproductive/Obstetrics negative OB ROS                              Anesthesia Physical Anesthesia Plan  ASA: 3  Anesthesia Plan: General   Post-op Pain Management:    Induction: Intravenous  PONV Risk Score and Plan:   Airway Management Planned: Oral ETT  Additional Equipment:   Intra-op Plan:   Post-operative Plan: Extubation in OR  Informed Consent: I have reviewed the patients History and Physical, chart, labs and discussed the procedure  including the risks, benefits and alternatives for the proposed anesthesia with the patient or authorized representative who has indicated his/her understanding and acceptance.       Plan Discussed with:   Anesthesia Plan Comments: (Increased risks of post op DVT 2nd distant hx)        Anesthesia Quick Evaluation  "

## 2024-08-07 NOTE — Plan of Care (Signed)
  Problem: Skin Integrity: Goal: Demonstrates signs of wound healing without infection Outcome: Progressing

## 2024-08-07 NOTE — H&P (Signed)
" °  ° ° °  HPI:  Bruce Brady is a 74 y.o. year old  Here today for RALP + BPLND  S/p MRI fusion bx (05/23/24) - GS 4+3=7 in 1/13 cores (~65% involvement), GS 3+4=7 in 7/13 cores   Prior HPI: PSA 6.2 (Aug 2025), %free 8.2 MRI (03/27/24) - PIRADS 4 at Left lateral mid PZ   - 23g gland   No prior abdominal surgeries Denies MI, stroke, CV disease Able to >3 METS without SOB or angina  No blood thinners   Hx of BPH on Flomax  Hx of ED on sildenafil  20mg  PRN No Fhx of CaP, Fhx of breast Ca in sister    PMH: Past Medical History:  Diagnosis Date   Cancer (HCC)    Prostate   Dilated aortic root    Dyspnea    Dysrhythmia    Gout    H/O blood clots    Hypertension    Pre-diabetes     Surgical History: Past Surgical History:  Procedure Laterality Date   CATARACT EXTRACTION W/PHACO Left 10/31/2023   Procedure: CATARACT EXTRACTION PHACO AND INTRAOCULAR LENS PLACEMENT (IOC) LEFT 7.79 00:54.7;  Surgeon: Myrna Adine Anes, MD;  Location[facility];  Service: Ophthalmology;  Laterality: Left;   CATARACT EXTRACTION W/PHACO Right 11/14/2023   Procedure: CATARACT EXTRACTION PHACO AND INTRAOCULAR LENS PLACEMENT (IOC) RIGHT 9.53, 01:07.0;  Surgeon: Myrna Adine Anes, MD;  Location: [facility];  Service: Ophthalmology;  Laterality: Right;   COLONOSCOPY W/ POLYPECTOMY      Home Medications:   Allergies: Allergies[1]  Family History: Family History  Problem Relation Age of Onset   Heart attack Mother    Heart attack Brother 72    Social History:  reports that he has never smoked. He has never used smokeless tobacco. He reports that he does not currently use alcohol. He reports that he does not use drugs.  ROS: Negative aside from those stated in the HPI.  Physical Exam: BP (!) 145/83   Pulse 93   Temp (!) 97.2 F (36.2 C) (Temporal)   Resp 16   Ht 5' 9 (1.753 m)   Wt 91.3 kg   SpO2 94%   BMI 29.73 kg/m    General: no acute distress, alert/oriented,  conversational  HEENT: equal nondilated pupils CV: regular rate Lung: unlabored breathing, regular rate and rhythm  Abd: nondistended, nontender with palpation, no palpable masses  MSK: moving all extremities without issue, normal observed motor function      Assessment & Plan:   Unfavorable intermediate-risk prostate Ca    - PSA 6.2 (Aug 2025), %free 8.2   - MRI (03/27/24) - PIRADS 4 at Left lateral mid PZ,  23g gland   - Fusion bx (05/23/24) - GS 4+3=7 in 1/13 cores (~65% involvement), GS 3+4=7 in 7/13 cores   IPSS 11/3 SHIM 14  Proceed to OR today: RALP + BPLND Informed consent signed Bilateral site marking  Penne JONELLE Skye, MD  [facility] [address], [address] [place], [place] [address] [phone]      [1]  Allergies Allergen Reactions   Hydrochlorothiazide Other (See Comments)    GOUT FLARE UP   "

## 2024-08-08 ENCOUNTER — Other Ambulatory Visit: Payer: Self-pay

## 2024-08-08 ENCOUNTER — Encounter: Payer: Self-pay | Admitting: Urology

## 2024-08-08 DIAGNOSIS — C61 Malignant neoplasm of prostate: Principal | ICD-10-CM

## 2024-08-08 LAB — CBC
HCT: 33.8 % — ABNORMAL LOW (ref 39.0–52.0)
Hemoglobin: 11.3 g/dL — ABNORMAL LOW (ref 13.0–17.0)
MCH: 28.5 pg (ref 26.0–34.0)
MCHC: 33.4 g/dL (ref 30.0–36.0)
MCV: 85.1 fL (ref 80.0–100.0)
Platelets: 276 K/uL (ref 150–400)
RBC: 3.97 MIL/uL — ABNORMAL LOW (ref 4.22–5.81)
RDW: 14.7 % (ref 11.5–15.5)
WBC: 13.2 K/uL — ABNORMAL HIGH (ref 4.0–10.5)
nRBC: 0 % (ref 0.0–0.2)

## 2024-08-08 LAB — BASIC METABOLIC PANEL WITH GFR
Anion gap: 9 (ref 5–15)
BUN: 16 mg/dL (ref 8–23)
CO2: 21 mmol/L — ABNORMAL LOW (ref 22–32)
Calcium: 9.5 mg/dL (ref 8.9–10.3)
Chloride: 108 mmol/L (ref 98–111)
Creatinine, Ser: 1.16 mg/dL (ref 0.61–1.24)
GFR, Estimated: >60 mL/min
Glucose, Bld: 159 mg/dL — ABNORMAL HIGH (ref 70–99)
Potassium: 4.3 mmol/L (ref 3.5–5.1)
Sodium: 138 mmol/L (ref 135–145)

## 2024-08-08 MED ORDER — DOCUSATE SODIUM 100 MG PO CAPS
100.0000 mg | ORAL_CAPSULE | Freq: Two times a day (BID) | ORAL | 0 refills | Status: AC
  Filled 2024-08-08: qty 10, 5d supply, fill #0

## 2024-08-08 MED ORDER — OXYCODONE HCL 5 MG PO TABS
5.0000 mg | ORAL_TABLET | Freq: Four times a day (QID) | ORAL | 0 refills | Status: AC | PRN
  Filled 2024-08-08: qty 4, 1d supply, fill #0

## 2024-08-08 MED ORDER — OXYBUTYNIN CHLORIDE 5 MG PO TABS
5.0000 mg | ORAL_TABLET | Freq: Three times a day (TID) | ORAL | 0 refills | Status: AC | PRN
  Filled 2024-08-08: qty 30, 10d supply, fill #0

## 2024-08-08 NOTE — Discharge Summary (Signed)
 Date of admission: 08/07/2024  Date of discharge: 08/08/2024  Admission diagnosis: Prostate cancer  Discharge diagnosis: Same as above  Secondary diagnoses:  Patient Active Problem List   Diagnosis Date Noted   Prostate cancer (HCC) 05/29/2024   Prediabetes 05/14/2022   Acid reflux 04/23/2022   Ascending aorta dilation 01/23/2021   LVH (left ventricular hypertrophy) 08/05/2016   Gout 03/15/2014   Elevated prostate specific antigen (PSA) 01/03/2014   Testicular hypofunction 04/21/2012   Male erectile disorder (CODE) 04/19/2012   Hyperlipidemia 07/14/2009   Hypertension 05/02/2009    History and Physical: For full details, please see admission history and physical. Briefly, Bruce Brady is a 74 y.o. year old patient admitted on 08/07/2024 for scheduled robotic assisted laparoscopic radical prostatectomy with bilateral pelvic lymph node dissection, bilateral nerve-sparing, with Dr. Georganne for management of prostate cancer.   Appropriate changes on postop labs this morning.  He is afebrile, VSS.  Diet has been advanced.  Foley catheter in place draining clear, pink-tinged urine.  He had an early a.m. emesis episode, however his nausea subsequently resolved that he was able to tolerate breakfast and lunch without difficulty.  As of midday, pain is well-controlled, he has ambulated, and Foley catheter is draining well.SABRA  Physical Exam: Constitutional:  Alert and oriented, no acute distress, nontoxic appearing HEENT: [place], AT Cardiovascular: No clubbing, cyanosis, or edema Respiratory: Normal respiratory effort, no increased work of breathing GI: Abdomen is soft with appropriate postoperative tenderness, no rigidity or rebound. Incisions clean/dry/intact with overlying surgical adhesive. Skin: No rashes, bruises or suspicious lesions Neurologic: Grossly intact, no focal deficits, moving all 4 extremities Psychiatric: Normal mood and affect   Hospital Course: Patient tolerated the  procedure well.  He was then transferred to the floor after an uneventful PACU stay.  His hospital course was uncomplicated.  On POD#1 he had met discharge criteria: was eating a regular diet, was up and ambulating independently,  pain was well controlled, catheter was draining well, and was ready for discharge.  Laboratory values:  Recent Labs    08/08/24 0546  WBC 13.2*  HGB 11.3*  HCT 33.8*   Recent Labs    08/08/24 0546  NA 138  K 4.3  CL 108  CO2 21*  GLUCOSE 159*  BUN 16  CREATININE 1.16  CALCIUM 9.5   Results for orders placed or performed during the hospital encounter of 08/02/24  Urine Culture     Status: Abnormal   Collection Time: 08/02/24 10:54 AM   Specimen: Urine, Clean Catch  Result Value Ref Range Status   Specimen Description   Final    URINE, CLEAN CATCH Performed at [facility], [address], [place], [place] [address]    Special Requests   Final    NONE Performed at [facility], [address], [place], [place] [address]    Culture (A)  Final    <10,000 COLONIES/mL INSIGNIFICANT GROWTH Performed at [facility], [address] [place], [place] [address]    Report Status 08/03/2024 FINAL  Final   Disposition: Home  Discharge instruction: The patient was instructed to be ambulatory but told to refrain from heavy lifting, strenuous activity, or driving.  Foley care instructions provided by nursing.  Discharge medications:  Allergies as of 08/08/2024       Reactions   Hydrochlorothiazide Other (See Comments)   GOUT FLARE UP        Medication List     STOP taking these medications  tamsulosin  0.4 MG Caps capsule Commonly known as: FLOMAX        TAKE these medications    amLODipine 10 MG tablet Commonly known as: NORVASC Take 10 mg by mouth daily.   aspirin EC 81 MG tablet Take 1 tablet by mouth daily.   atorvastatin 10 MG tablet Commonly known as: LIPITOR Take 10 mg by mouth every other day.    carvedilol 12.5 MG tablet Commonly known as: COREG Take 12.5 mg by mouth 2 (two) times daily.   docusate sodium  100 MG capsule Commonly known as: Colace Take 1 capsule (100 mg total) by mouth 2 (two) times daily.   losartan 100 MG tablet Commonly known as: COZAAR Take 100 mg by mouth daily.   oxybutynin  5 MG tablet Commonly known as: DITROPAN  Take 1 tablet (5 mg total) by mouth every 8 (eight) hours as needed for bladder spasms.   oxyCODONE  5 MG immediate release tablet Commonly known as: Oxy IR/ROXICODONE  Take 1 tablet (5 mg total) by mouth every 6 (six) hours as needed for severe pain (pain score 7-10).   sildenafil  20 MG tablet Commonly known as: REVATIO  Take 1 tablet (20 mg total) by mouth 3 (three) times daily.   spironolactone 25 MG tablet Commonly known as: ALDACTONE Take 25 mg by mouth daily.        Followup:   Follow-up Information     Helon Kirsch A, PA-C Follow up on 08/17/2024.   Specialty: Urology Why: For catheter removal Contact information: [address] [place] [address] [phone]

## 2024-08-08 NOTE — Care Management Obs Status (Signed)
 MEDICARE OBSERVATION STATUS NOTIFICATION   Patient Details  Name: Bruce Brady MRN: [id number] Date of Birth: 12-26-1950   Medicare Observation Status Notification Given:  Yes    Rae Sutcliffe W, CMA 08/08/2024, 2:55 PM

## 2024-08-08 NOTE — Progress Notes (Signed)
 DISCHARGE NOTE:   Pt dc with IV removed and dc instructions given. Pt educated on how to empty and care for foley catheter. Pt voices no questions or concerns at this time. Pt received medications delivered to hospital room. Pt voices no questions or concerns at this time. Pt wheeled down to medical mall entrance by staff. Pt's stepson provided transportation.

## 2024-08-09 NOTE — Anesthesia Postprocedure Evaluation (Signed)
"   Anesthesia Post Note  Patient: Bruce Brady  Procedure(s) Performed: PROSTATECTOMY, RADICAL, ROBOT-ASSISTED, LAPAROSCOPIC (Prostate) LYMPHADENECTOMY, PELVIS, ROBOT-ASSISTED (Pelvis)  Patient location during evaluation: PACU Anesthesia Type: General Level of consciousness: awake and alert Pain management: pain level controlled Vital Signs Assessment: post-procedure vital signs reviewed and stable Respiratory status: spontaneous breathing, nonlabored ventilation, respiratory function stable and patient connected to nasal cannula oxygen Cardiovascular status: blood pressure returned to baseline and stable Postop Assessment: no apparent nausea or vomiting Anesthetic complications: no   No notable events documented.   Last Vitals:  Vitals:   08/08/24 0804 08/08/24 1341  BP: 120/75 124/74  Pulse: 100 (!) 101  Resp: 17 15  Temp: 36.7 C 37 C  SpO2: 97%     Last Pain:  Vitals:   08/08/24 1341  TempSrc: Oral  PainSc:                  [name]      "

## 2024-08-13 ENCOUNTER — Ambulatory Visit: Payer: Self-pay | Admitting: Urology

## 2024-08-13 LAB — SURGICAL PATHOLOGY

## 2024-08-14 ENCOUNTER — Encounter: Admitting: Physician Assistant

## 2024-08-16 NOTE — Progress Notes (Unsigned)
" ° °  Patient is a 74 year old male presenting for scheduled Foley catheter removal following recent robotic prostatectomy.  Reports mild discomfort at the catheter site but no significant pain. Denies fever, chills, abdominal pain, or flank pain. Urine has been draining well through the catheter at home. No issues with leakage around the catheter. Patient understands the plan for voiding trial today.  Objective: Vitals: Stable and within normal limits. General: Alert, oriented, no acute distress. GU: Foley catheter in place, draining clear yellow urine. No evidence of urethral discharge, significant edema, or signs of infection at the meatus. Urine output: Adequate. Catheter: Removed in the office without difficulty. Patient tolerated procedure well.  Catheter Removal Patient is present today for a catheter removal.  10 ml of water  was drained from the balloon. A 20FR foley cath was removed from the bladder, no complications were noted. Patient tolerated well.  Performed by: Marry Sara, PA-C  Assessment: Status post radical prostatectomy for prostate cancer. Foley catheter removal  Monitoring for urinary retention, bladder spasms, hematuria, or infection.  Plan: Foley catheter successfully removed today. Instructed patient to attempt to void within 4 hours after removal. Patient advised to increase oral hydration throughout the day. Educated on expected temporary symptoms, such as mild burning with urination or light pink urine. Reviewed red flags: inability to urinate within 4-6 hours, worsening abdominal discomfort, fever, chills, passage of large blood clots, or heavy bleeding. If unable to void, patient instructed to return to the office or present to the ER for bladder scan and possible catheter reinsertion. Continue postoperative restrictions per surgeons instructions (avoid heavy lifting, strenuous activity, etc.). Follow-up: 4 weeks for office visit with Dr. Georganne, encouraged  follow up explaining that close monitoring of the PSA after undergoing surgery for prostate cancer is important so that we can detect any prostate cancer recurrence quickly and address it. "

## 2024-08-17 ENCOUNTER — Ambulatory Visit (INDEPENDENT_AMBULATORY_CARE_PROVIDER_SITE_OTHER)

## 2024-08-17 VITALS — BP 133/83 | HR 101 | Wt 200.0 lb

## 2024-08-17 DIAGNOSIS — C61 Malignant neoplasm of prostate: Secondary | ICD-10-CM

## 2024-09-03 ENCOUNTER — Other Ambulatory Visit

## 2024-09-06 ENCOUNTER — Ambulatory Visit: Admitting: Urology

## 2024-09-13 ENCOUNTER — Ambulatory Visit: Admitting: Urology
